# Patient Record
Sex: Female | Born: 1980 | Race: White | Hispanic: No | Marital: Single | Smoking: Never smoker
Health system: Southern US, Community
[De-identification: ages and names within clinical notes are randomized; demographics above are authoritative.]

## PROBLEM LIST (undated history)

## (undated) ENCOUNTER — Inpatient Hospital Stay (HOSPITAL_COMMUNITY): Payer: Self-pay

## (undated) DIAGNOSIS — B379 Candidiasis, unspecified: Secondary | ICD-10-CM

## (undated) DIAGNOSIS — F419 Anxiety disorder, unspecified: Secondary | ICD-10-CM

## (undated) DIAGNOSIS — G43909 Migraine, unspecified, not intractable, without status migrainosus: Secondary | ICD-10-CM

## (undated) DIAGNOSIS — K219 Gastro-esophageal reflux disease without esophagitis: Secondary | ICD-10-CM

## (undated) DIAGNOSIS — B999 Unspecified infectious disease: Secondary | ICD-10-CM

## (undated) DIAGNOSIS — T148XXA Other injury of unspecified body region, initial encounter: Secondary | ICD-10-CM

---

## 2008-04-30 ENCOUNTER — Inpatient Hospital Stay (HOSPITAL_COMMUNITY): Admission: AD | Admit: 2008-04-30 | Discharge: 2008-04-30 | Payer: Self-pay | Admitting: Obstetrics and Gynecology

## 2008-05-05 ENCOUNTER — Inpatient Hospital Stay (HOSPITAL_COMMUNITY): Admission: AD | Admit: 2008-05-05 | Discharge: 2008-05-07 | Payer: Self-pay | Admitting: Obstetrics and Gynecology

## 2009-03-02 ENCOUNTER — Encounter: Payer: Self-pay | Admitting: Obstetrics and Gynecology

## 2009-03-02 ENCOUNTER — Ambulatory Visit: Payer: Self-pay | Admitting: Obstetrics and Gynecology

## 2009-03-02 LAB — CONVERTED CEMR LAB
Antibody Screen: NEGATIVE
Basophils Absolute: 0 10*3/uL (ref 0.0–0.1)
Eosinophils Absolute: 0.1 10*3/uL (ref 0.0–0.7)
Eosinophils Relative: 1 % (ref 0–5)
Lymphocytes Relative: 12 % (ref 12–46)
Lymphs Abs: 1 10*3/uL (ref 0.7–4.0)
Neutrophils Relative %: 78 % — ABNORMAL HIGH (ref 43–77)
Platelets: 248 10*3/uL (ref 150–400)
RDW: 14.3 % (ref 11.5–15.5)
Rubella: >500 intl units/mL — ABNORMAL HIGH
WBC: 8 10*3/uL (ref 4.0–10.5)

## 2009-03-03 ENCOUNTER — Encounter: Payer: Self-pay | Admitting: Obstetrics and Gynecology

## 2009-03-03 LAB — CONVERTED CEMR LAB
Trich, Wet Prep: NONE SEEN
Yeast Wet Prep HPF POC: NONE SEEN

## 2009-03-09 ENCOUNTER — Ambulatory Visit (HOSPITAL_COMMUNITY): Admission: RE | Admit: 2009-03-09 | Discharge: 2009-03-09 | Payer: Self-pay | Admitting: Family Medicine

## 2009-03-23 ENCOUNTER — Ambulatory Visit: Payer: Self-pay | Admitting: Obstetrics and Gynecology

## 2009-03-23 ENCOUNTER — Encounter: Payer: Self-pay | Admitting: Physician Assistant

## 2009-03-30 ENCOUNTER — Encounter: Payer: Self-pay | Admitting: Physician Assistant

## 2009-03-30 ENCOUNTER — Ambulatory Visit: Payer: Self-pay | Admitting: Obstetrics and Gynecology

## 2009-04-06 ENCOUNTER — Ambulatory Visit: Payer: Self-pay | Admitting: Obstetrics and Gynecology

## 2009-04-13 ENCOUNTER — Ambulatory Visit: Payer: Self-pay | Admitting: Obstetrics and Gynecology

## 2009-04-17 ENCOUNTER — Inpatient Hospital Stay (HOSPITAL_COMMUNITY): Admission: AD | Admit: 2009-04-17 | Discharge: 2009-04-20 | Payer: Self-pay | Admitting: Obstetrics and Gynecology

## 2009-04-17 ENCOUNTER — Ambulatory Visit: Payer: Self-pay | Admitting: Obstetrics and Gynecology

## 2010-06-11 LAB — POCT URINALYSIS DIP (DEVICE)
Glucose, UA: NEGATIVE mg/dL
Glucose, UA: NEGATIVE mg/dL
Hgb urine dipstick: NEGATIVE
Ketones, ur: >=160 mg/dL — AB
Ketones, ur: 80 mg/dL — AB
Protein, ur: >=300 mg/dL — AB
Specific Gravity, Urine: 1.02 (ref 1.005–1.030)
Specific Gravity, Urine: >=1.03 (ref 1.005–1.030)
Specific Gravity, Urine: >=1.03 (ref 1.005–1.030)
pH: 5.5 (ref 5.0–8.0)

## 2010-06-11 LAB — CBC
HCT: 26.9 % — ABNORMAL LOW (ref 36.0–46.0)
HCT: 27 % — ABNORMAL LOW (ref 36.0–46.0)
Hemoglobin: 8.6 g/dL — ABNORMAL LOW (ref 12.0–15.0)
MCHC: 30.8 g/dL (ref 30.0–36.0)
MCHC: 31.9 g/dL (ref 30.0–36.0)
MCV: 61.9 fL — ABNORMAL LOW (ref 78.0–100.0)
MCV: 61.9 fL — ABNORMAL LOW (ref 78.0–100.0)
Platelets: 299 10*3/uL (ref 150–400)
RBC: 4.35 MIL/uL (ref 3.87–5.11)
RDW: 15.6 % — ABNORMAL HIGH (ref 11.5–15.5)
WBC: 6.9 10*3/uL (ref 4.0–10.5)

## 2010-06-26 LAB — POCT URINALYSIS DIP (DEVICE)
Nitrite: POSITIVE — AB
Protein, ur: 100 mg/dL — AB
Specific Gravity, Urine: 1.025 (ref 1.005–1.030)
Urobilinogen, UA: 2 mg/dL — ABNORMAL HIGH (ref 0.0–1.0)

## 2010-06-27 LAB — POCT URINALYSIS DIP (DEVICE)
Glucose, UA: 100 mg/dL — AB
Hgb urine dipstick: NEGATIVE
Nitrite: NEGATIVE
Urobilinogen, UA: 1 mg/dL (ref 0.0–1.0)
pH: 6 (ref 5.0–8.0)

## 2010-07-11 LAB — CBC
MCHC: 32.5 g/dL (ref 30.0–36.0)
Platelets: 204 10*3/uL (ref 150–400)
RBC: 4.05 MIL/uL (ref 3.87–5.11)
RBC: 4.81 MIL/uL (ref 3.87–5.11)
RDW: 19.9 % — ABNORMAL HIGH (ref 11.5–15.5)
WBC: 16.7 10*3/uL — ABNORMAL HIGH (ref 4.0–10.5)

## 2010-07-11 LAB — RPR: RPR Ser Ql: NONREACTIVE
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
## 2010-10-01 ENCOUNTER — Emergency Department (HOSPITAL_COMMUNITY): Payer: Medicaid Other

## 2010-10-01 ENCOUNTER — Inpatient Hospital Stay (HOSPITAL_COMMUNITY)
Admission: EM | Admit: 2010-10-01 | Discharge: 2010-10-05 | DRG: 089 | Disposition: A | Discharge status: Home Health | Payer: Medicaid Other | Attending: Surgery | Admitting: Surgery

## 2010-10-01 DIAGNOSIS — R29898 Other symptoms and signs involving the musculoskeletal system: Secondary | ICD-10-CM | POA: Diagnosis present

## 2010-10-01 DIAGNOSIS — S139XXA Sprain of joints and ligaments of unspecified parts of neck, initial encounter: Secondary | ICD-10-CM | POA: Diagnosis present

## 2010-10-01 DIAGNOSIS — W010XXA Fall on same level from slipping, tripping and stumbling without subsequent striking against object, initial encounter: Secondary | ICD-10-CM | POA: Diagnosis present

## 2010-10-01 DIAGNOSIS — N390 Urinary tract infection, site not specified: Secondary | ICD-10-CM | POA: Diagnosis not present

## 2010-10-01 DIAGNOSIS — S060X0A Concussion without loss of consciousness, initial encounter: Principal | ICD-10-CM | POA: Diagnosis present

## 2010-10-01 DIAGNOSIS — R42 Dizziness and giddiness: Secondary | ICD-10-CM | POA: Diagnosis present

## 2010-10-01 LAB — POCT I-STAT, CHEM 8
BUN: 17 mg/dL (ref 6–23)
Chloride: 106 mEq/L (ref 96–112)
Sodium: 139 mEq/L (ref 135–145)

## 2010-10-01 LAB — CBC
HCT: 34.9 % — ABNORMAL LOW (ref 36.0–46.0)
Hemoglobin: 11.8 g/dL — ABNORMAL LOW (ref 12.0–15.0)
MCV: 73.6 fL — ABNORMAL LOW (ref 78.0–100.0)
RBC: 4.74 MIL/uL (ref 3.87–5.11)
WBC: 7.9 10*3/uL (ref 4.0–10.5)

## 2010-10-01 LAB — COMPREHENSIVE METABOLIC PANEL
ALT: 13 U/L (ref 0–35)
AST: 15 U/L (ref 0–37)
CO2: 22 mEq/L (ref 19–32)
Calcium: 9.4 mg/dL (ref 8.4–10.5)
Chloride: 102 mEq/L (ref 96–112)
GFR calc non Af Amer: >60 mL/min (ref >60)
Potassium: 3.9 mEq/L (ref 3.5–5.1)
Sodium: 135 mEq/L (ref 135–145)
Total Bilirubin: 0.5 mg/dL (ref 0.3–1.2)

## 2010-10-01 LAB — TYPE AND SCREEN

## 2010-10-01 LAB — LACTIC ACID, PLASMA: Lactic Acid, Venous: 0.9 mmol/L (ref 0.5–2.2)

## 2010-10-02 ENCOUNTER — Inpatient Hospital Stay (HOSPITAL_COMMUNITY): Payer: Medicaid Other

## 2010-10-02 DIAGNOSIS — IMO0002 Reserved for concepts with insufficient information to code with codable children: Secondary | ICD-10-CM

## 2010-10-03 DIAGNOSIS — IMO0002 Reserved for concepts with insufficient information to code with codable children: Secondary | ICD-10-CM

## 2010-10-03 DIAGNOSIS — S069X9A Unspecified intracranial injury with loss of consciousness of unspecified duration, initial encounter: Secondary | ICD-10-CM

## 2010-10-05 LAB — URINALYSIS, ROUTINE W REFLEX MICROSCOPIC
Ketones, ur: NEGATIVE mg/dL
Nitrite: NEGATIVE
Protein, ur: NEGATIVE mg/dL
pH: 6.5 (ref 5.0–8.0)

## 2010-10-05 LAB — URINE MICROSCOPIC-ADD ON

## 2010-10-06 LAB — URINE CULTURE
Colony Count: 15000
Culture  Setup Time: 201207121157

## 2010-10-19 ENCOUNTER — Encounter (INDEPENDENT_AMBULATORY_CARE_PROVIDER_SITE_OTHER): Payer: Medicaid Other
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
## 2012-03-26 NOTE — L&D Delivery Note (Signed)
Delivery Note At 11:18 PM a viable and healthy female was delivered via Vaginal, Spontaneous Delivery (Presentation:OA ;  ). Loose nuchal cord, delivered through  APGAR:8 ,9 ; weight pending .   Placenta status: intact with a 3 vessel Cord:  with the following complications:none.  Pt had persistent, slow trickle from the uterus, so cytotec given PR.    Anesthesia: Epidural  Episiotomy: None Lacerations: 1st degree tear of posterior vaginal mucosa not requiring sutures Suture Repair: None Est. Blood Loss (200cc):   Mom to postpartum.  Baby to Couplet care / Skin to Skin. Delivery supervised by Cathie Beams, CNM.     [name] 02/26/2013, 11:44 PM    I have seen and examined this patient and agree the above assessment. CRESENZO-DISHMAN,[name] 02/27/2013 3:51 AM

## 2012-03-26 NOTE — L&D Delivery Note (Signed)
Attestation of Attending Supervision of Advanced Practitioner: Evaluation and management procedures were performed by the PA/NP/CNM/OB Fellow under my supervision/collaboration. Chart reviewed and agree with management and plan.  Evander Macaraeg V 03/04/2013 1:31 PM

## 2012-09-12 ENCOUNTER — Inpatient Hospital Stay (HOSPITAL_COMMUNITY)
Admission: AD | Admit: 2012-09-12 | Discharge: 2012-09-12 | Disposition: A | Discharge status: Home / Self Care | Payer: Medicaid Other | Source: Ambulatory Visit | Attending: Obstetrics & Gynecology | Admitting: Obstetrics & Gynecology

## 2012-09-12 ENCOUNTER — Encounter (HOSPITAL_COMMUNITY): Payer: Self-pay | Admitting: *Deleted

## 2012-09-12 DIAGNOSIS — R109 Unspecified abdominal pain: Secondary | ICD-10-CM | POA: Insufficient documentation

## 2012-09-12 DIAGNOSIS — O99891 Other specified diseases and conditions complicating pregnancy: Secondary | ICD-10-CM | POA: Insufficient documentation

## 2012-09-12 DIAGNOSIS — N898 Other specified noninflammatory disorders of vagina: Secondary | ICD-10-CM

## 2012-09-12 DIAGNOSIS — L293 Anogenital pruritus, unspecified: Secondary | ICD-10-CM | POA: Insufficient documentation

## 2012-09-12 DIAGNOSIS — N949 Unspecified condition associated with female genital organs and menstrual cycle: Secondary | ICD-10-CM

## 2012-09-12 DIAGNOSIS — M545 Low back pain, unspecified: Secondary | ICD-10-CM | POA: Insufficient documentation

## 2012-09-12 DIAGNOSIS — Z3490 Encounter for supervision of normal pregnancy, unspecified, unspecified trimester: Secondary | ICD-10-CM

## 2012-09-12 HISTORY — DX: Unspecified infectious disease: B99.9

## 2012-09-12 HISTORY — DX: Candidiasis, unspecified: B37.9

## 2012-09-12 LAB — URINALYSIS, ROUTINE W REFLEX MICROSCOPIC
Glucose, UA: NEGATIVE mg/dL
Hgb urine dipstick: NEGATIVE
Specific Gravity, Urine: 1.02 (ref 1.005–1.030)
pH: 7.5 (ref 5.0–8.0)

## 2012-09-12 LAB — WET PREP, GENITAL: Yeast Wet Prep HPF POC: NONE SEEN

## 2012-09-12 MED ORDER — TERCONAZOLE 0.8 % VA CREA: 1.0000 | TOPICAL_CREAM | Freq: Every day | VAGINAL | Status: DC

## 2012-09-12 NOTE — MAU Note (Signed)
Pain in sides around to back, came on really stronger yesterday.  Having cramping in lower abd. Still not feeling any movement.  Has not seen a dr yet.

## 2012-09-12 NOTE — MAU Provider Note (Signed)
History     CSN: [id number]  Arrival date and time: 09/12/12 6213   First Provider Initiated Contact with Patient 09/12/12 1102      Chief Complaint  Patient presents with  . Abdominal Pain  . Back Pain   HPI [name] is 32 y.o. G3P2 [redacted]w[redacted]d weeks presenting with lower abdominal pain that wraps to lower back.  Also has vaginal discharge with itching and burning.   Denies vaginal bleeding.  Has not yet felt fetal movement.  Hx of yeast and UTIs.  Has not begun prenatal care , need verification letter.  Plans care in the [facility].     Past Medical History  Diagnosis Date  . Infection   . UTI (lower urinary tract infection)   . Yeast infection     Past Surgical History  Procedure Laterality Date  . No past surgeries      History reviewed. No pertinent family history.  History  Substance Use Topics  . Smoking status: Not on file  . Smokeless tobacco: Not on file  . Alcohol Use: Not on file    Allergies: No Known Allergies  Prescriptions prior to admission  Medication Sig Dispense Refill  . acetaminophen (TYLENOL) 500 MG tablet Take 1,500 mg by mouth daily as needed (headaches).      . ranitidine (ZANTAC) 75 MG tablet Take 75 mg by mouth daily.        Review of Systems  Constitutional: Negative for fever and chills.  Gastrointestinal: Positive for abdominal pain (lower abdominal pain). Negative for nausea and vomiting.  Musculoskeletal: Positive for back pain.  Neurological: Negative for headaches.   Physical Exam   Blood pressure 113/68, pulse 94, temperature 98.1 F (36.7 C), temperature source Oral, resp. rate 16, last menstrual period 05/12/2012.  Physical Exam  Constitutional: She is oriented to person, place, and time. She appears well-developed and well-nourished. No distress.  HENT:  Head: Normocephalic.  Neck: Normal range of motion.  Cardiovascular: Normal rate.   Respiratory: Effort normal.  GI: Soft. She exhibits no distension  and no mass. There is no tenderness. There is no rebound and no guarding.  Genitourinary: There is no rash, tenderness or lesion on the right labia. There is no rash, tenderness or lesion on the left labia. Uterus is enlarged (measures 17-18 week size). Uterus is not tender. Cervix exhibits no motion tenderness, no discharge and no friability. No bleeding around the vagina. Vaginal discharge (small amount of white discharge without odor) found.  Neurological: She is alert and oriented to person, place, and time.  Skin: Skin is warm and dry.  Psychiatric: She has a normal mood and affect. Her behavior is normal.   + FHT by doppler  Results for orders placed during the hospital encounter of 09/12/12 (from the past 24 hour(s))  URINALYSIS, ROUTINE W REFLEX MICROSCOPIC     Status: Abnormal   Collection Time    09/12/12 10:05 AM      Result Value Range   Color, Urine YELLOW  YELLOW   APPearance CLOUDY (*) CLEAR   Specific Gravity, Urine 1.020  1.005 - 1.030   pH 7.5  5.0 - 8.0   Glucose, UA NEGATIVE  NEGATIVE mg/dL   Hgb urine dipstick NEGATIVE  NEGATIVE   Bilirubin Urine NEGATIVE  NEGATIVE   Ketones, ur NEGATIVE  NEGATIVE mg/dL   Protein, ur NEGATIVE  NEGATIVE mg/dL   Urobilinogen, UA 0.2  0.0 - 1.0 mg/dL   Nitrite NEGATIVE  NEGATIVE  Leukocytes, UA NEGATIVE  NEGATIVE  WET PREP, GENITAL     Status: Abnormal   Collection Time    09/12/12 10:54 AM      Result Value Range   Yeast Wet Prep HPF POC NONE SEEN  NONE SEEN   Trich, Wet Prep NONE SEEN  NONE SEEN   Clue Cells Wet Prep HPF POC NONE SEEN  NONE SEEN   WBC, Wet Prep HPF POC MANY (*) NONE SEEN   MAU Course  Procedures  MDM   Assessment and Plan  A:  Viable IUP with + FHT by doppler at [redacted]w[redacted]d     Vaginal itching    Round ligament pain P:  Letter of verification given to patient     Rx for Terazol 3 vaginal creme     Begin prenatal care with clinic-call for appointment [name],EVE M 09/12/2012, 12:24 PM

## 2012-09-12 NOTE — MAU Note (Addendum)
Patient presents to MAU with c/o lower abdominal cramping radiating from back x 2 days; vaginal itching and burning with white discharge. Reports hx of yeast infections and UTIs.  Patient has no PNC. Needs pg verification for Medicaid. Patient reports daily headache, including today.

## 2012-09-14 NOTE — MAU Provider Note (Signed)
Attestation of Attending Supervision of Advanced Practitioner (CNM/NP): Evaluation and management procedures were performed by the Advanced Practitioner under my supervision and collaboration. I have reviewed the Advanced Practitioner's note and chart, and I agree with the management and plan.  LEGGETT,KELLY H. 11:42 AM   

## 2012-10-08 ENCOUNTER — Ambulatory Visit (INDEPENDENT_AMBULATORY_CARE_PROVIDER_SITE_OTHER): Payer: Medicaid Other | Admitting: Advanced Practice Midwife

## 2012-10-08 ENCOUNTER — Encounter: Payer: Self-pay | Admitting: Advanced Practice Midwife

## 2012-10-08 VITALS — BP 104/71 | Temp 97.1°F | Ht 61.0 in | Wt 136.0 lb

## 2012-10-08 DIAGNOSIS — Z3201 Encounter for pregnancy test, result positive: Secondary | ICD-10-CM

## 2012-10-08 DIAGNOSIS — G43909 Migraine, unspecified, not intractable, without status migrainosus: Secondary | ICD-10-CM

## 2012-10-08 DIAGNOSIS — O0930 Supervision of pregnancy with insufficient antenatal care, unspecified trimester: Secondary | ICD-10-CM

## 2012-10-08 DIAGNOSIS — O0932 Supervision of pregnancy with insufficient antenatal care, second trimester: Secondary | ICD-10-CM

## 2012-10-08 LAB — POCT URINALYSIS DIP (DEVICE)
Glucose, UA: NEGATIVE mg/dL
Hgb urine dipstick: NEGATIVE
Ketones, ur: NEGATIVE mg/dL
Nitrite: NEGATIVE
Specific Gravity, Urine: 1.025 (ref 1.005–1.030)

## 2012-10-08 MED ORDER — PRENATAL VITAMINS PLUS 27-1 MG PO TABS: 1.0000 | ORAL_TABLET | Freq: Every day | ORAL | Status: DC

## 2012-10-08 MED ORDER — BUTALBITAL-APAP-CAFFEINE 50-325-40 MG PO TABS: 1.0000 | ORAL_TABLET | Freq: Four times a day (QID) | ORAL | Status: DC | PRN

## 2012-10-08 NOTE — Progress Notes (Signed)
Pulse 95 Edema trace in feet. C/o pain and pressure in groin area. Vaginal d/c stated as white with itch.

## 2012-10-08 NOTE — Progress Notes (Signed)
New OB. Seen other note C/O round ligament pain and some uterine cramping. Pap today.  Glucola today  Also had migraines  Subjective:    [name] is a [id number] [redacted]w[redacted]d being seen today for her first obstetrical visit.  Her obstetrical history is significant for Late prenatal care. Patient does intend to breast feed. Pregnancy history fully reviewed.  Patient reports cramping, round ligament pain, and migraines.  Filed Vitals:   10/08/12 0831 10/08/12 0834  BP: 104/71   Temp: 97.1 F (36.2 C)   Height:  5\' 1"  (1.549 m)  Weight: 61.689 kg (136 lb)     HISTORY: OB History   Grav Para Term Preterm Abortions TAB SAB Ect Mult Living   3 2 2       2      # Outc Date GA Lbr Len/2nd Wgt Sex Del Anes PTL Lv   1 TRM  [redacted]w[redacted]d  4.540JW(1XB1YN)  SVD   Yes   2 TRM  [redacted]w[redacted]d  8.295AO(1HY8MV)  SVD   Yes   3 CUR              Past Medical History  Diagnosis Date  . Infection   . UTI (lower urinary tract infection)   . Yeast infection    Past Surgical History  Procedure Laterality Date  . No past surgeries     Family History  Problem Relation Age of Onset  . Diabetes Mother      Exam    Uterus:     Pelvic Exam:    Perineum: No Hemorrhoids, Normal Perineum   Vulva: Bartholin's, Urethra, Skene's normal   Vagina:  normal discharge, wet prep done   pH:    Cervix: multiparous appearance and no lesions   Adnexa: normal adnexa and no mass, fullness, tenderness   Bony Pelvis: gynecoid  System: Breast:  normal appearance, no masses or tenderness   Skin: normal coloration and turgor, no rashes    Neurologic: oriented, grossly non-focal   Extremities: normal strength, tone, and muscle mass   HEENT neck supple with midline trachea   Mouth/Teeth mucous membranes moist, pharynx normal without lesions   Neck supple and no masses   Cardiovascular: regular rate and rhythm, no murmurs or gallops   Respiratory:  appears well, vitals normal, no respiratory distress, acyanotic, normal RR, ear  and throat exam is normal, neck free of mass or lymphadenopathy, chest clear, no wheezing, crepitations, rhonchi, normal symmetric air entry   Abdomen: soft, non-tender; bowel sounds normal; no masses,  no organomegaly   Urinary: urethral meatus normal      Assessment:    Pregnancy: [id number] Patient Active Problem List   Diagnosis Date Noted  . Late prenatal care complicating pregnancy in second trimester 10/09/2012        Plan:     Initial labs drawn. Prenatal vitamins. Problem list reviewed and updated. Genetic Screening discussed Integrated Screen: too late.  Ultrasound discussed; fetal survey: ordered.  Follow up in 4 weeks. 50% of 30 min visit spent on counseling and coordination of care.  Pap done   [facility] 10/09/2012

## 2012-10-09 DIAGNOSIS — O0932 Supervision of pregnancy with insufficient antenatal care, second trimester: Secondary | ICD-10-CM | POA: Insufficient documentation

## 2012-10-09 LAB — OBSTETRIC PANEL
Eosinophils Absolute: 0.1 10*3/uL (ref 0.0–0.7)
Hemoglobin: 10.4 g/dL — ABNORMAL LOW (ref 12.0–15.0)
Hepatitis B Surface Ag: NEGATIVE
Lymphs Abs: 0.8 10*3/uL (ref 0.7–4.0)
MCH: 24.6 pg — ABNORMAL LOW (ref 26.0–34.0)
MCV: 74.7 fL — ABNORMAL LOW (ref 78.0–100.0)
Monocytes Relative: 7 % (ref 3–12)
Neutrophils Relative %: 79 % — ABNORMAL HIGH (ref 43–77)
RBC: 4.23 MIL/uL (ref 3.87–5.11)
Rh Type: POSITIVE

## 2012-10-09 LAB — GLUCOSE TOLERANCE, 1 HOUR (50G) W/O FASTING: Glucose, 1 Hour GTT: 99 mg/dL (ref 70–140)

## 2012-10-09 NOTE — Patient Instructions (Signed)
Pregnancy - Second Trimester The second trimester is the period between 13 to 27 weeks of your pregnancy. It is important to follow your doctor's instructions. HOME CARE   Do not smoke.  Do not drink alcohol or use drugs.  Only take medicine as told by your doctor.  Take prenatal vitamins as told. The vitamin should contain 1 milligram of folic acid.  Exercise.  Eat healthy foods. Eat regular, well-balanced meals.  You can have sex (intercourse) if there are no other problems with the pregnancy.  Do not use hot tubs, steam rooms, or saunas.  Wear a seat belt while driving.  Avoid raw meat, uncooked cheese, and litter boxes and soil used by cats.  Visit your dentist. Cleanings are okay. GET HELP RIGHT AWAY IF:   You have a temperature by mouth above 102 F (38.9 C), not controlled by medicine.  Fluid is coming from your vagina.  Blood is coming from your vagina. Light spotting is common, especially after sex (intercourse).  You have a bad smelling fluid (discharge) coming from the vagina. The fluid changes from clear to white.  You still feel sick to your stomach (nauseous).  You throw up (vomit) blood.  You lose or gain more than 2 pounds (0.9 kilograms) of weight in a week, or as suggested by your doctor.  Your face, hands, feet, or legs get puffy (swell).  You get exposed to German measles and have never had them.  You get exposed to fifth disease or chickenpox.  You have belly (abdominal) pain.  You have a bad headache that will not go away.  You have watery poop (diarrhea), pain when you pee (urinate), or have shortness of breath.  You start to have problems seeing (blurry or double vision).  You fall, are in a car accident, or have any kind of trauma.  There is mental or physical violence at home.  You have any concerns or worries during your pregnancy. MAKE SURE YOU:   Understand these instructions.  Will watch your condition.  Will get help  right away if you are not doing well or get worse. Document Released: 06/06/2009 Document Revised: 06/04/2011 Document Reviewed: 06/06/2009 ExitCare Patient Information 2014 [employer].  

## 2012-10-13 ENCOUNTER — Ambulatory Visit (HOSPITAL_COMMUNITY): Payer: Self-pay

## 2012-10-14 ENCOUNTER — Ambulatory Visit (HOSPITAL_COMMUNITY): Payer: Self-pay | Attending: Advanced Practice Midwife

## 2012-10-22 ENCOUNTER — Ambulatory Visit (HOSPITAL_COMMUNITY)
Admission: RE | Admit: 2012-10-22 | Discharge: 2012-10-22 | Disposition: A | Discharge status: Home / Self Care | Payer: Medicaid Other | Source: Ambulatory Visit | Attending: Advanced Practice Midwife | Admitting: Advanced Practice Midwife

## 2012-10-22 ENCOUNTER — Encounter: Payer: Self-pay | Admitting: Advanced Practice Midwife

## 2012-10-22 DIAGNOSIS — O358XX0 Maternal care for other (suspected) fetal abnormality and damage, not applicable or unspecified: Secondary | ICD-10-CM | POA: Insufficient documentation

## 2012-10-22 DIAGNOSIS — Z363 Encounter for antenatal screening for malformations: Secondary | ICD-10-CM | POA: Insufficient documentation

## 2012-10-22 DIAGNOSIS — Z1389 Encounter for screening for other disorder: Secondary | ICD-10-CM | POA: Insufficient documentation

## 2012-10-22 DIAGNOSIS — Z3201 Encounter for pregnancy test, result positive: Secondary | ICD-10-CM

## 2012-10-30 ENCOUNTER — Telehealth: Payer: Self-pay | Admitting: *Deleted

## 2012-10-30 DIAGNOSIS — G43909 Migraine, unspecified, not intractable, without status migrainosus: Secondary | ICD-10-CM

## 2012-10-30 MED ORDER — BUTALBITAL-APAP-CAFFEINE 50-325-40 MG PO TABS: 1.0000 | ORAL_TABLET | Freq: Four times a day (QID) | ORAL | Status: DC | PRN

## 2012-10-30 NOTE — Telephone Encounter (Signed)
[name] called and left a message stating she was in the office  A few weeks ago and that she must have left her prescription in the office-because she can't find them and was waiting to get her mediciad toget them filled.  Asks for a call back.  Per chart review 10/09/12 had PNV sent to [facility] and fioricet printed.

## 2012-10-30 NOTE — Telephone Encounter (Signed)
Discussed with Dr,. Harraway-Smith may send in refill fioricet to her pharmacy.  E- prescibed, but did not go thru- called in to pharmacy.  Called [place] and notified her we are sending it in to her  pharmacy

## 2012-11-05 ENCOUNTER — Ambulatory Visit (INDEPENDENT_AMBULATORY_CARE_PROVIDER_SITE_OTHER): Payer: Medicaid Other | Admitting: Advanced Practice Midwife

## 2012-11-05 DIAGNOSIS — O0930 Supervision of pregnancy with insufficient antenatal care, unspecified trimester: Secondary | ICD-10-CM

## 2012-11-05 MED ORDER — LEVOTHYROXINE SODIUM 100 MCG PO TABS: 100.0000 ug | ORAL_TABLET | Freq: Every day | ORAL | Status: DC

## 2012-11-05 MED ORDER — CABERGOLINE 0.5 MG PO TABS: 0.2500 mg | ORAL_TABLET | ORAL | Status: DC

## 2012-11-05 NOTE — Progress Notes (Signed)
Chart opened, but patient was a no show for appointment

## 2012-11-25 ENCOUNTER — Encounter: Payer: Medicaid Other | Admitting: Family Medicine

## 2012-12-18 ENCOUNTER — Encounter: Payer: Self-pay | Admitting: *Deleted

## 2012-12-23 ENCOUNTER — Ambulatory Visit (INDEPENDENT_AMBULATORY_CARE_PROVIDER_SITE_OTHER): Payer: Medicaid Other | Admitting: Obstetrics & Gynecology

## 2012-12-23 VITALS — BP 117/74

## 2012-12-23 DIAGNOSIS — A499 Bacterial infection, unspecified: Secondary | ICD-10-CM

## 2012-12-23 DIAGNOSIS — N760 Acute vaginitis: Secondary | ICD-10-CM

## 2012-12-23 DIAGNOSIS — O0930 Supervision of pregnancy with insufficient antenatal care, unspecified trimester: Secondary | ICD-10-CM

## 2012-12-23 DIAGNOSIS — N898 Other specified noninflammatory disorders of vagina: Secondary | ICD-10-CM

## 2012-12-23 DIAGNOSIS — Z23 Encounter for immunization: Secondary | ICD-10-CM

## 2012-12-23 DIAGNOSIS — O9989 Other specified diseases and conditions complicating pregnancy, childbirth and the puerperium: Secondary | ICD-10-CM

## 2012-12-23 DIAGNOSIS — O0932 Supervision of pregnancy with insufficient antenatal care, second trimester: Secondary | ICD-10-CM

## 2012-12-23 DIAGNOSIS — B9689 Other specified bacterial agents as the cause of diseases classified elsewhere: Secondary | ICD-10-CM

## 2012-12-23 LAB — POCT URINALYSIS DIP (DEVICE)
Glucose, UA: 100 mg/dL — AB
Hgb urine dipstick: NEGATIVE
Nitrite: NEGATIVE

## 2012-12-23 MED ORDER — TETANUS-DIPHTH-ACELL PERTUSSIS 5-2.5-18.5 LF-MCG/0.5 IM SUSP
0.5000 mL | Freq: Once | INTRAMUSCULAR | Status: AC
  Administered 2012-12-23: 0.5 mL via INTRAMUSCULAR

## 2012-12-23 NOTE — Progress Notes (Signed)
P = 103    Pt continues to have vaginal itching and d/c w/odor.

## 2012-12-23 NOTE — Progress Notes (Signed)
Pelvic exam showed scant yellow-white discharge, wet prep done.  Will follow up results and manage accordingly.  Counseled about Tdap, she will receive this today.  No other complaints or concerns.  Fetal movement and labor precautions reviewed.

## 2012-12-23 NOTE — Patient Instructions (Signed)
Return to clinic for any obstetric concerns or go to MAU for evaluation  

## 2012-12-24 ENCOUNTER — Encounter: Payer: Self-pay | Admitting: *Deleted

## 2012-12-24 LAB — WET PREP, GENITAL: Trich, Wet Prep: NONE SEEN

## 2012-12-25 MED ORDER — METRONIDAZOLE 500 MG PO TABS
500.0000 mg | ORAL_TABLET | Freq: Two times a day (BID) | ORAL | Status: AC
Start: 2012-12-25 — End: 2013-01-01

## 2012-12-25 NOTE — Addendum Note (Signed)
Addended by: Jaynie Collins A on: 12/25/2012 04:34 PM   Modules accepted: Orders

## 2012-12-30 ENCOUNTER — Telehealth: Payer: Self-pay | Admitting: *Deleted

## 2012-12-30 NOTE — Telephone Encounter (Signed)
Message copied by Mannie Stabile on Tue Dec 30, 2012 10:13 AM ------      Message from: Odelia Gage A      Created: Fri Dec 26, 2012  7:43 AM                   ----- Message -----         From: Tereso Newcomer, MD         Sent: 12/25/2012   4:34 PM           To: [facility] [employer] prep showed clue cells, Metronidazole e-prescribed. Please call  to inform her of diagnosis and treatment, and to go pick up the prescription. ------

## 2012-12-30 NOTE — Telephone Encounter (Signed)
Left message for patient to call back for results.  

## 2013-01-04 ENCOUNTER — Encounter (HOSPITAL_COMMUNITY): Payer: Self-pay | Admitting: *Deleted

## 2013-01-04 ENCOUNTER — Inpatient Hospital Stay (HOSPITAL_COMMUNITY)
Admission: AD | Admit: 2013-01-04 | Discharge: 2013-01-04 | Disposition: A | Discharge status: Home / Self Care | Payer: Medicaid Other | Source: Ambulatory Visit | Attending: Obstetrics & Gynecology | Admitting: Obstetrics & Gynecology

## 2013-01-04 DIAGNOSIS — R109 Unspecified abdominal pain: Secondary | ICD-10-CM | POA: Insufficient documentation

## 2013-01-04 DIAGNOSIS — O2390 Unspecified genitourinary tract infection in pregnancy, unspecified trimester: Secondary | ICD-10-CM | POA: Insufficient documentation

## 2013-01-04 DIAGNOSIS — N760 Acute vaginitis: Secondary | ICD-10-CM | POA: Insufficient documentation

## 2013-01-04 DIAGNOSIS — B9689 Other specified bacterial agents as the cause of diseases classified elsewhere: Secondary | ICD-10-CM | POA: Insufficient documentation

## 2013-01-04 DIAGNOSIS — A499 Bacterial infection, unspecified: Secondary | ICD-10-CM | POA: Insufficient documentation

## 2013-01-04 DIAGNOSIS — O4700 False labor before 37 completed weeks of gestation, unspecified trimester: Secondary | ICD-10-CM | POA: Insufficient documentation

## 2013-01-04 DIAGNOSIS — E860 Dehydration: Secondary | ICD-10-CM

## 2013-01-04 LAB — URINALYSIS, ROUTINE W REFLEX MICROSCOPIC
Leukocytes, UA: NEGATIVE
Nitrite: NEGATIVE
Specific Gravity, Urine: 1.025 (ref 1.005–1.030)
Urobilinogen, UA: 2 mg/dL — ABNORMAL HIGH (ref 0.0–1.0)

## 2013-01-04 NOTE — MAU Note (Signed)
Contractions 5 mins apart for the last 5 hours. Good fetal movement.  Denies leaking of fluid and vaginal bleeding.

## 2013-01-04 NOTE — MAU Provider Note (Signed)
Chief Complaint:  Contractions   [name] is a 32 y.o.  [id number] with IUP at [redacted]w[redacted]d presenting for Contractions  States has been having pains since 3:30. Having constant lower abdominal pain and then about every 5 minutes gets a sharp vaginal pain. +FM. No vb, LOF.  No recent intercourse. Lots of increased stress. Less fluid intake. Has a hx of migraines and drinks at least 2 sodas a day for extra caffeine. Doesn't drink water. Occasionally juice.  Most days she drinks only around 24 oz of liquid.   Pt is seen in [facility] and at last visit was diagnosed with BV but they were unable to reach her with RX. Still having vaginal dsicharge and never picked up the medicine.    Menstrual History: OB History   Grav Para Term Preterm Abortions TAB SAB Ect Mult Living   3 2 2       2        Patient's last menstrual period was 05/12/2012.      Past Medical History  Diagnosis Date  . Infection   . UTI (lower urinary tract infection)   . Yeast infection     Past Surgical History  Procedure Laterality Date  . No past surgeries      Family History  Problem Relation Age of Onset  . Diabetes Mother     History  Substance Use Topics  . Smoking status: Never Smoker   . Smokeless tobacco: Never Used  . Alcohol Use: No     No Known Allergies  Prescriptions prior to admission  Medication Sig Dispense Refill  . butalbital-acetaminophen-caffeine (FIORICET) 50-325-40 MG per tablet Take 1-2 tablets by mouth every 6 (six) hours as needed for headache.  30 tablet  0  . Prenatal Vit-Fe Fumarate-FA (PRENATAL MULTIVITAMIN) TABS tablet Take 1 tablet by mouth daily at 12 noon.      . ranitidine (ZANTAC) 75 MG tablet Take 75 mg by mouth daily.        Review of Systems - Negative except for what is mentioned in HPI.  Physical Exam  Blood pressure 118/74, pulse 101, temperature 97.1 F (36.2 C), temperature source Oral, resp. rate 18, height 5\' 1"  (1.549 m), weight 63.957 kg (141 lb), last  menstrual period 05/12/2012, SpO2 99.00%. GENERAL: Well-developed, well-nourished female in no acute distress.  LUNGS: Clear to auscultation bilaterally.  HEART: Regular rate and rhythm. ABDOMEN: Soft, nontender, nondistended, gravid.  EXTREMITIES: Nontender, no edema, 2+ distal pulses. Cervical Exam: Dilatation 0.5cm   Effacement 0%   Station ballotable   Presentation: cephalic FHT:  Baseline rate 125 bpm   Variability moderate  Accelerations present   Decelerations none Contractions: no regular contractions, irritable  Labs: Results for orders placed during the hospital encounter of 01/04/13 (from the past 24 hour(s))  URINALYSIS, ROUTINE W REFLEX MICROSCOPIC   Collection Time    01/04/13  8:32 PM      Result Value Range   Color, Urine YELLOW  YELLOW   APPearance CLEAR  CLEAR   Specific Gravity, Urine 1.025  1.005 - 1.030   pH 7.0  5.0 - 8.0   Glucose, UA 100 (*) NEGATIVE mg/dL   Hgb urine dipstick NEGATIVE  NEGATIVE   Bilirubin Urine NEGATIVE  NEGATIVE   Ketones, ur >80 (*) NEGATIVE mg/dL   Protein, ur NEGATIVE  NEGATIVE mg/dL   Urobilinogen, UA 2.0 (*) 0.0 - 1.0 mg/dL   Nitrite NEGATIVE  NEGATIVE   Leukocytes, UA NEGATIVE  NEGATIVE    Imaging  Studies:  No results found.  Assessment: [name] is  32 y.o. G3P2002 at [redacted]w[redacted]d presents with Contractions .  Plan: 1) abdominal pain/uterine irritability - likely due to dehydration. Pt with story of very poor po intake and UA with large ketones and high specific gravity.  - discussed normal fluid intake in pregnancy - if needs  The two sodas in the day for her headache, then needs to drink double that in water to cancel it out and then more as well - PTL precautions also discussed - cervix not a laboring cervix  2) bacterial vaginosis - likely the source of the sharp pains also - rx of flagyl already at pharmacy - pt states she doesn't have the money today but will get it tomorrow for the RX - has appt on Tuesday in  clinic and advised to let us know if she was able to get it.   F/u as scheduled in clinic.   Jiovanni Heeter L 10/12/20149:37 PM

## 2013-01-06 ENCOUNTER — Encounter: Payer: Self-pay | Admitting: Obstetrics & Gynecology

## 2013-01-06 ENCOUNTER — Ambulatory Visit (INDEPENDENT_AMBULATORY_CARE_PROVIDER_SITE_OTHER): Payer: Medicaid Other | Admitting: Obstetrics & Gynecology

## 2013-01-06 VITALS — BP 112/73 | Temp 97.7°F | Wt 141.8 lb

## 2013-01-06 DIAGNOSIS — Z23 Encounter for immunization: Secondary | ICD-10-CM

## 2013-01-06 DIAGNOSIS — O0930 Supervision of pregnancy with insufficient antenatal care, unspecified trimester: Secondary | ICD-10-CM

## 2013-01-06 DIAGNOSIS — O0932 Supervision of pregnancy with insufficient antenatal care, second trimester: Secondary | ICD-10-CM

## 2013-01-06 MED ORDER — SUMATRIPTAN SUCCINATE 100 MG PO TABS: 100.0000 mg | ORAL_TABLET | Freq: Once | ORAL | Status: DC | PRN

## 2013-01-06 NOTE — Progress Notes (Signed)
P= 104 Pt. States she was seen in the ED two days ago for which they were supposed to have prescribed her something for BV (c/o of yellow itchy vaginal discharge) but when she went to pick up the precription it wasn't there; according to notes she was prescribed flagyl and it was sent to her pharmacy.  Pt. Needs refill of prenatals.

## 2013-01-06 NOTE — Progress Notes (Signed)
Routine visit. Good FM. Labor precautions given. Cultures at next visit. Flu vaccine today. She says that the fiorocet isn't working for her migraines. She is willing to try imitrex.

## 2013-01-07 NOTE — Telephone Encounter (Signed)
This was discussed at the visit on 01/06/13

## 2013-01-20 ENCOUNTER — Encounter: Payer: Medicaid Other | Admitting: Obstetrics and Gynecology

## 2013-02-16 ENCOUNTER — Encounter (HOSPITAL_COMMUNITY): Payer: Self-pay

## 2013-02-16 ENCOUNTER — Inpatient Hospital Stay (HOSPITAL_COMMUNITY): Payer: Medicaid Other

## 2013-02-16 ENCOUNTER — Inpatient Hospital Stay (HOSPITAL_COMMUNITY)
Admission: AD | Admit: 2013-02-16 | Discharge: 2013-02-16 | Disposition: A | Discharge status: Home / Self Care | Payer: Medicaid Other | Source: Ambulatory Visit | Attending: Obstetrics and Gynecology | Admitting: Obstetrics and Gynecology

## 2013-02-16 DIAGNOSIS — R0602 Shortness of breath: Secondary | ICD-10-CM | POA: Insufficient documentation

## 2013-02-16 DIAGNOSIS — O368190 Decreased fetal movements, unspecified trimester, not applicable or unspecified: Secondary | ICD-10-CM | POA: Insufficient documentation

## 2013-02-16 DIAGNOSIS — O368131 Decreased fetal movements, third trimester, fetus 1: Secondary | ICD-10-CM

## 2013-02-16 DIAGNOSIS — I951 Orthostatic hypotension: Secondary | ICD-10-CM

## 2013-02-16 DIAGNOSIS — O99891 Other specified diseases and conditions complicating pregnancy: Secondary | ICD-10-CM | POA: Insufficient documentation

## 2013-02-16 DIAGNOSIS — R42 Dizziness and giddiness: Secondary | ICD-10-CM

## 2013-02-16 DIAGNOSIS — R000 Tachycardia, unspecified: Secondary | ICD-10-CM | POA: Insufficient documentation

## 2013-02-16 LAB — CBC
HCT: 26.5 % — ABNORMAL LOW (ref 36.0–46.0)
MCHC: 30.9 g/dL (ref 30.0–36.0)
MCV: 64.6 fL — ABNORMAL LOW (ref 78.0–100.0)
Platelets: 145 10*3/uL — ABNORMAL LOW (ref 150–400)
RBC: 4.1 MIL/uL (ref 3.87–5.11)
RDW: 16.6 % — ABNORMAL HIGH (ref 11.5–15.5)
WBC: 7.2 10*3/uL (ref 4.0–10.5)

## 2013-02-16 LAB — RAPID URINE DRUG SCREEN, HOSP PERFORMED
Barbiturates: NOT DETECTED
Benzodiazepines: NOT DETECTED
Cocaine: NOT DETECTED
Opiates: NOT DETECTED
Tetrahydrocannabinol: NOT DETECTED

## 2013-02-16 LAB — URINALYSIS, ROUTINE W REFLEX MICROSCOPIC
Glucose, UA: NEGATIVE mg/dL
Hgb urine dipstick: NEGATIVE
Protein, ur: NEGATIVE mg/dL
Specific Gravity, Urine: >1.03 — ABNORMAL HIGH (ref 1.005–1.030)
pH: 6 (ref 5.0–8.0)

## 2013-02-16 MED ORDER — LACTATED RINGERS IV BOLUS (SEPSIS)
1000.0000 mL | Freq: Once | INTRAVENOUS | Status: AC
  Administered 2013-02-16: 1000 mL via INTRAVENOUS

## 2013-02-16 NOTE — MAU Note (Signed)
Patient states she has felt less than usual fetal movement since yesterday. States she has had trouble with feeling like she is not getting enough air when she takes a breath. Denies bleeding or leaking.

## 2013-02-16 NOTE — Progress Notes (Signed)
Dr Reola Calkins notified

## 2013-02-16 NOTE — MAU Provider Note (Signed)
Chief Complaint:  Decreased Fetal Movement and Shortness of Breath   [name] is a 32 y.o.  [id number] with IUP at [redacted]w[redacted]d presenting for Decreased Fetal Movement and Shortness of Breath  1) decreased FM - has been feeling her baby move but not as actively as before - feeling little movements still but no big "flips" since 2 days ago - even smaller movements are less close together than before  - no ctx, LOF, VB  2) Shortness of breath - has been going on for the last month  - whether shallow breathing or normal breathings, feels like she cannot get enough air - bothers her constantly through the day and at night - slightly worse with moving around - walks 2-3 steps and then feels more winded.  - has no energy at baseline  - has not had to increase the number of pillows she uses at night, no worsening swelling.   - no fevers, chills, wheezing, cough.   - kids currently sick with a cold.      Menstrual History: OB History   Grav Para Term Preterm Abortions TAB SAB Ect Mult Living   3 2 2       2      G1- term, NSVD G2- term, NSVD, 8lb 8oz     Patient's last menstrual period was 05/12/2012.      Past Medical History  Diagnosis Date  . Infection   . UTI (lower urinary tract infection)   . Yeast infection     Past Surgical History  Procedure Laterality Date  . No past surgeries      Family History  Problem Relation Age of Onset  . Diabetes Mother     History  Substance Use Topics  . Smoking status: Never Smoker   . Smokeless tobacco: Never Used  . Alcohol Use: No     No Known Allergies  Prescriptions prior to admission  Medication Sig Dispense Refill  . Acetaminophen (TYLENOL PO) Take 1 tablet by mouth every 6 (six) hours as needed.      . Prenatal Vit-Fe Fumarate-FA (PRENATAL MULTIVITAMIN) TABS tablet Take 1 tablet by mouth daily at 12 noon.      . SUMAtriptan (IMITREX) 100 MG tablet Take 1 tablet (100 mg total) by mouth once as needed for migraine.  May repeat in 2 hours if headache persists or recurs.  9 tablet  11    Review of Systems - Negative except for what is mentioned in HPI.  Physical Exam  Blood pressure 107/71, pulse 124, temperature 99.2 F (37.3 C), temperature source Oral, resp. rate 18, height 5\' 2"  (1.575 m), weight 63.322 kg (139 lb 9.6 oz), last menstrual period 05/12/2012, SpO2 100.00%. GENERAL: Well-developed, well-nourished female in no acute distress. Dry mucous membranes.  HEENT: TMs clear bilaterally.  Oropharynx clear.  +Dix-hallpike to the left with horizontal nystagmus.  LUNGS: Clear to auscultation bilaterally.  HEART: tachycardic. regular rhythm. No murmurs.  ABDOMEN: Soft, nontender, nondistended, gravid.  EXTREMITIES: Nontender, no edema, 2+ distal pulses. Sluggish capillary refill.   FHT:  Baseline rate 130 bpm   Variability moderate  Accelerations present   Decelerations none Contractions: quiet   Labs: No results found for this or any previous visit (from the past 24 hour(s)).  Imaging Studies:  No results found.  Assessment: [name] is  32 y.o. G3P2002 at [redacted]w[redacted]d presents with Decreased Fetal Movement and Shortness of Breath She was found on exam to be tachycardic particularly with walking (upon admission  was 150 and then quickly decreased).  While in the MAU, developed room spinning dizziness. +dix-hallpike as above.  Plans as below.   Plan:  1) decreased FM - reactive NST on the monitor - cat I tracing - still no movement felt by pt - pt now at due date and with visit next week and no postdates testing. As such, will send today for BPP - BPP of 8/8  - reassurance given - kick counts discussed  2) shortness of breath - lungs clear bilaterally and on discussion, struggles more with taking in a deep breath but does not describe any wheezing, orthopnea, pnd, or other symptoms.  - suspect it is more related to position of baby - pt comfortable in bed and does not appear to be having  difficulty - given 2L LR bolus for tachycardia and improved her SOB completely. Unclear as to mechanism but at time of discahrge, pt states her symptoms of dyspnea were resolved.   3) tachycardia  - unclear etiology. Likely related to dehydration with spec gravity of >1.0030 and hx of multiple caffeinated beverages daily until recently and very little other substances. Also could be related to caffeine withdrawal.  Autonomic dysfunction considered but less likely - orthostatics positive with increase in HR from 89 when laying to 138 when standing. No change with walking.  EKG showing sinus tachycardia  - cbc showing anemia with hgb of 8.2 but after fluids so proportion is likely dilutional.  - after 2L of bolus, HR improved to 110  - encouraged po hydration on a regular basis. - start daily iron   - has appt next week to be seen and recommend it be checked at that time   4) dizziness - acute onset in the MAU - pos dix-hallpike to the left - hx of vertigo in previous pregnancy - likely vertigo again - - discussed conservative management and home exercises for rehab - handout with exercises given   5) f/u in 1 week as scheduled in clinic. Discussed induction time frame as well for her. Pt discharged home today as no indication for induction.   Case discussed with Dr. Jolayne Panther.    Katielynn Horan L 11/24/20141:54 PM

## 2013-02-18 NOTE — MAU Provider Note (Signed)
Attestation of Attending Supervision of Advanced Practitioner (CNM/NP): Evaluation and management procedures were performed by the Advanced Practitioner under my supervision and collaboration.  I have reviewed the Advanced Practitioner's note and chart, and I agree with the management and plan.  Carlen Fils 02/18/2013 10:31 AM

## 2013-02-24 ENCOUNTER — Ambulatory Visit (INDEPENDENT_AMBULATORY_CARE_PROVIDER_SITE_OTHER): Payer: Medicaid Other | Admitting: Advanced Practice Midwife

## 2013-02-24 ENCOUNTER — Encounter: Payer: Self-pay | Admitting: Advanced Practice Midwife

## 2013-02-24 VITALS — BP 119/77 | Wt 138.9 lb

## 2013-02-24 DIAGNOSIS — O0930 Supervision of pregnancy with insufficient antenatal care, unspecified trimester: Secondary | ICD-10-CM

## 2013-02-24 DIAGNOSIS — O480 Post-term pregnancy: Secondary | ICD-10-CM

## 2013-02-24 DIAGNOSIS — Z3490 Encounter for supervision of normal pregnancy, unspecified, unspecified trimester: Secondary | ICD-10-CM

## 2013-02-24 DIAGNOSIS — O0932 Supervision of pregnancy with insufficient antenatal care, second trimester: Secondary | ICD-10-CM

## 2013-02-24 DIAGNOSIS — Z8759 Personal history of other complications of pregnancy, childbirth and the puerperium: Secondary | ICD-10-CM

## 2013-02-24 DIAGNOSIS — Z8742 Personal history of other diseases of the female genital tract: Secondary | ICD-10-CM

## 2013-02-24 LAB — POCT URINALYSIS DIP (DEVICE)
Bilirubin Urine: NEGATIVE
Glucose, UA: NEGATIVE mg/dL
Ketones, ur: NEGATIVE mg/dL
Nitrite: NEGATIVE
Protein, ur: NEGATIVE mg/dL
Specific Gravity, Urine: 1.025 (ref 1.005–1.030)
Urobilinogen, UA: 1 mg/dL (ref 0.0–1.0)
pH: 6.5 (ref 5.0–8.0)

## 2013-02-24 LAB — OB RESULTS CONSOLE GC/CHLAMYDIA
Chlamydia: NEGATIVE
Gonorrhea: NEGATIVE

## 2013-02-24 NOTE — Progress Notes (Signed)
P=134 Clear discharge, desires induction of labor. NST

## 2013-02-24 NOTE — Progress Notes (Signed)
Ready to be induced. Irregular contractions. IOL scheduled 02/25/13 at 1930hrs.  Probably Foley + Pitocin   GBS and cultures done (found to have been missed due to [employer])

## 2013-02-24 NOTE — Patient Instructions (Signed)

## 2013-02-25 ENCOUNTER — Inpatient Hospital Stay (HOSPITAL_COMMUNITY)
Admission: RE | Admit: 2013-02-25 | Discharge: 2013-02-28 | DRG: 775 | Disposition: A | Discharge status: Home / Self Care | Payer: Medicaid Other | Source: Ambulatory Visit | Attending: Obstetrics and Gynecology | Admitting: Obstetrics and Gynecology

## 2013-02-25 ENCOUNTER — Encounter (HOSPITAL_COMMUNITY): Payer: Self-pay | Admitting: *Deleted

## 2013-02-25 ENCOUNTER — Telehealth (HOSPITAL_COMMUNITY): Payer: Self-pay | Admitting: *Deleted

## 2013-02-25 ENCOUNTER — Encounter (HOSPITAL_COMMUNITY): Payer: Self-pay

## 2013-02-25 VITALS — BP 103/67 | HR 80 | Temp 97.8°F | Resp 18 | Ht 61.0 in | Wt 140.0 lb

## 2013-02-25 DIAGNOSIS — O480 Post-term pregnancy: Principal | ICD-10-CM | POA: Diagnosis present

## 2013-02-25 DIAGNOSIS — O0932 Supervision of pregnancy with insufficient antenatal care, second trimester: Secondary | ICD-10-CM

## 2013-02-25 LAB — CBC
HCT: 29.6 % — ABNORMAL LOW (ref 36.0–46.0)
MCH: 20.1 pg — ABNORMAL LOW (ref 26.0–34.0)
MCV: 64.1 fL — ABNORMAL LOW (ref 78.0–100.0)
RBC: 4.62 MIL/uL (ref 3.87–5.11)
RDW: 18.3 % — ABNORMAL HIGH (ref 11.5–15.5)
WBC: 7.6 10*3/uL (ref 4.0–10.5)

## 2013-02-25 LAB — GC/CHLAMYDIA PROBE AMP: CT Probe RNA: NEGATIVE

## 2013-02-25 MED ORDER — LIDOCAINE HCL (PF) 1 % IJ SOLN
30.0000 mL | INTRAMUSCULAR | Status: DC | PRN
  Filled 2013-02-25 (×2): qty 30

## 2013-02-25 MED ORDER — CITRIC ACID-SODIUM CITRATE 334-500 MG/5ML PO SOLN
30.0000 mL | ORAL | Status: DC | PRN
  Administered 2013-02-26: 30 mL via ORAL
  Filled 2013-02-25: qty 15

## 2013-02-25 MED ORDER — OXYTOCIN BOLUS FROM INFUSION: 500.0000 mL | INTRAVENOUS | Status: DC

## 2013-02-25 MED ORDER — OXYTOCIN 40 UNITS IN LACTATED RINGERS INFUSION - SIMPLE MED
62.5000 mL/h | INTRAVENOUS | Status: DC
  Filled 2013-02-25: qty 1000

## 2013-02-25 MED ORDER — MISOPROSTOL 25 MCG QUARTER TABLET
25.0000 ug | ORAL_TABLET | ORAL | Status: DC | PRN
  Administered 2013-02-25 – 2013-02-26 (×2): 25 ug via VAGINAL
  Filled 2013-02-25: qty 1
  Filled 2013-02-25 (×2): qty 0.25

## 2013-02-25 MED ORDER — IBUPROFEN 600 MG PO TABS
600.0000 mg | ORAL_TABLET | Freq: Four times a day (QID) | ORAL | Status: DC | PRN
  Administered 2013-02-27: 600 mg via ORAL
  Filled 2013-02-25: qty 1

## 2013-02-25 MED ORDER — LACTATED RINGERS IV SOLN
INTRAVENOUS | Status: DC
  Administered 2013-02-25 – 2013-02-26 (×4): via INTRAVENOUS

## 2013-02-25 MED ORDER — TERBUTALINE SULFATE 1 MG/ML IJ SOLN: 0.2500 mg | Freq: Once | INTRAMUSCULAR | Status: AC | PRN

## 2013-02-25 MED ORDER — ONDANSETRON HCL 4 MG/2ML IJ SOLN
4.0000 mg | Freq: Four times a day (QID) | INTRAMUSCULAR | Status: DC | PRN
  Administered 2013-02-25 – 2013-02-27 (×2): 4 mg via INTRAVENOUS
  Filled 2013-02-25 (×2): qty 2

## 2013-02-25 MED ORDER — OXYCODONE-ACETAMINOPHEN 5-325 MG PO TABS
1.0000 | ORAL_TABLET | ORAL | Status: DC | PRN
  Administered 2013-02-27: 1 via ORAL
  Filled 2013-02-25: qty 1

## 2013-02-25 MED ORDER — ACETAMINOPHEN 325 MG PO TABS
650.0000 mg | ORAL_TABLET | ORAL | Status: DC | PRN
  Administered 2013-02-26: 650 mg via ORAL
  Filled 2013-02-25: qty 2

## 2013-02-25 MED ORDER — LACTATED RINGERS IV SOLN
500.0000 mL | INTRAVENOUS | Status: DC | PRN
  Administered 2013-02-26 (×2): 500 mL via INTRAVENOUS

## 2013-02-25 NOTE — Telephone Encounter (Signed)
Preadmission screen  

## 2013-02-25 NOTE — H&P (Signed)
Otilia Kareem is a 32 y.o. female presenting for IOL for postdates. Complains of occassional dizziness and feeling like her heart is racing when laying flat. Normal fetal movement no lof no vb no ctx.   Prenatal care signficant for late care and missed her last appointment but Otherwise uncomplicated.  Marland Kitchen History OB History   Grav Para Term Preterm Abortions TAB SAB Ect Mult Living   3 2 2       2      Past Medical History  Diagnosis Date  . Infection   . UTI (lower urinary tract infection)   . Yeast infection    Past Surgical History  Procedure Laterality Date  . No past surgeries     Family History: family history includes Diabetes in her mother. Social History:  reports that she has never smoked. She has never used smokeless tobacco. She reports that she does not drink alcohol or use illicit drugs.  [facility]  Clinic  Low Risk  Dating LMP/Ultrasound:  23 weeks        Ultrasound consistent with LMP: Yes  Genetic Screen 1 Screen/Quad:    Too Late  Anatomic [place]  Normal  Glucose Screen  1 hr GTT 99  TDaP vaccine 12/23/2012  Flu vaccine  01-06-13  GBS  Missed at term due to [facility], done at 41.1wks (12/2)  Baby Food  Breast  Contraception  Mirena  Pediatrician [facility]    Prenatal Transfer Tool  Maternal Diabetes: No Genetic Screening: Normal Maternal Ultrasounds/Referrals: Normal Fetal Ultrasounds or other Referrals:  None Maternal Substance Abuse:  No Significant Maternal Medications:  Meds include: Other: imitrex Significant Maternal Lab Results:  GBS pending Other Comments:  None  ROS Dizziness and palpitations when laying flat. No headaches vision changes, LOC, no CP, no n/v, no d/c, urinary symptoms or other complaints Dilation: 2 Effacement (%): 60 Station: -3 Exam by:: Dr. Ike Bene Blood pressure 121/88, pulse 119, temperature 97.9 F (36.6 C), temperature source Oral, resp. rate 18, height 5\' 1"  (1.549 m), weight 63.504 kg (140 lb), last menstrual period  05/12/2012, SpO2 97.00%. Exam Physical Exam  VSS NAD Tachycadic, no m/g CTAB no wrc Gravid Leopold 3000g No c/c/e  Dilation: 2 Effacement (%): 60 Station: -3 Presentation: Vertex Exam by:: Dr. Ike Bene    Prenatal labs: ABO, Rh: O/POS/-- (07/16 1008) Antibody: NEG (07/16 1008) Rubella: 18.00 (07/16 1008) RPR: NON REAC (07/16 1008)  HBsAg: NEGATIVE (07/16 1008)  HIV: NON REACTIVE (07/16 1008)  GBS:     Assessment/Plan: Kayda Allers is a 32 y.o. G3P2002 at [redacted]w[redacted]d  here for IOL for postdates at 110w2d #Labor: will start with cytotec #Pain: Desires IV pain meds and epidural #FWB: Cat I #ID:  GBS pending from yesterday, will follow up in AM,  #MOF: breast #MOC:mirena  Karanveer Ramakrishnan RYAN 02/25/2013, 9:00 PM

## 2013-02-26 ENCOUNTER — Encounter (HOSPITAL_COMMUNITY): Payer: Medicaid Other | Admitting: Anesthesiology

## 2013-02-26 ENCOUNTER — Encounter (HOSPITAL_COMMUNITY): Payer: Self-pay

## 2013-02-26 ENCOUNTER — Inpatient Hospital Stay (HOSPITAL_COMMUNITY): Payer: Medicaid Other | Admitting: Anesthesiology

## 2013-02-26 DIAGNOSIS — O480 Post-term pregnancy: Secondary | ICD-10-CM | POA: Diagnosis present

## 2013-02-26 LAB — TYPE AND SCREEN
ABO/RH(D): O POS
Antibody Screen: NEGATIVE

## 2013-02-26 LAB — RPR: RPR Ser Ql: NONREACTIVE

## 2013-02-26 LAB — CULTURE, BETA STREP (GROUP B ONLY)

## 2013-02-26 LAB — ABO/RH: ABO/RH(D): O POS

## 2013-02-26 MED ORDER — MISOPROSTOL 200 MCG PO TABS
800.0000 ug | ORAL_TABLET | Freq: Once | ORAL | Status: AC
  Administered 2013-02-26: 800 ug via RECTAL
  Filled 2013-02-26: qty 4

## 2013-02-26 MED ORDER — FENTANYL 2.5 MCG/ML BUPIVACAINE 1/10 % EPIDURAL INFUSION (WH - ANES)
14.0000 mL/h | INTRAMUSCULAR | Status: DC | PRN
  Administered 2013-02-26: 14 mL/h via EPIDURAL
  Filled 2013-02-26 (×2): qty 125

## 2013-02-26 MED ORDER — PHENYLEPHRINE 40 MCG/ML (10ML) SYRINGE FOR IV PUSH (FOR BLOOD PRESSURE SUPPORT)
80.0000 ug | PREFILLED_SYRINGE | INTRAVENOUS | Status: DC | PRN
  Filled 2013-02-26: qty 2

## 2013-02-26 MED ORDER — OXYTOCIN 40 UNITS IN LACTATED RINGERS INFUSION - SIMPLE MED
1.0000 m[IU]/min | INTRAVENOUS | Status: DC
  Administered 2013-02-26: 2 m[IU]/min via INTRAVENOUS

## 2013-02-26 MED ORDER — EPHEDRINE 5 MG/ML INJ
10.0000 mg | INTRAVENOUS | Status: DC | PRN
  Filled 2013-02-26: qty 4
  Filled 2013-02-26: qty 2

## 2013-02-26 MED ORDER — FAMOTIDINE 20 MG PO TABS
10.0000 mg | ORAL_TABLET | Freq: Every day | ORAL | Status: DC
  Administered 2013-02-26: 10 mg via ORAL
  Filled 2013-02-26: qty 1

## 2013-02-26 MED ORDER — LACTATED RINGERS IV SOLN: 500.0000 mL | Freq: Once | INTRAVENOUS | Status: DC

## 2013-02-26 MED ORDER — EPHEDRINE 5 MG/ML INJ
10.0000 mg | INTRAVENOUS | Status: DC | PRN
  Filled 2013-02-26: qty 2

## 2013-02-26 MED ORDER — PHENYLEPHRINE 40 MCG/ML (10ML) SYRINGE FOR IV PUSH (FOR BLOOD PRESSURE SUPPORT)
80.0000 ug | PREFILLED_SYRINGE | INTRAVENOUS | Status: DC | PRN
  Filled 2013-02-26: qty 10
  Filled 2013-02-26: qty 2

## 2013-02-26 MED ORDER — FENTANYL 2.5 MCG/ML BUPIVACAINE 1/10 % EPIDURAL INFUSION (WH - ANES)
INTRAMUSCULAR | Status: DC | PRN
Start: 2013-02-26 — End: 2013-02-26
  Administered 2013-02-26: 14 mL/h via EPIDURAL

## 2013-02-26 MED ORDER — DIPHENHYDRAMINE HCL 50 MG/ML IJ SOLN: 12.5000 mg | INTRAMUSCULAR | Status: DC | PRN

## 2013-02-26 MED ORDER — FENTANYL CITRATE 0.05 MG/ML IJ SOLN
100.0000 ug | INTRAMUSCULAR | Status: DC | PRN
  Administered 2013-02-26 (×2): 100 ug via INTRAVENOUS
  Filled 2013-02-26 (×2): qty 2

## 2013-02-26 MED ORDER — LIDOCAINE HCL (PF) 1 % IJ SOLN
INTRAMUSCULAR | Status: DC | PRN
  Administered 2013-02-26: 7 mL
  Administered 2013-02-26: 9 mL

## 2013-02-26 NOTE — Progress Notes (Signed)
[name] is a 32 y.o. G3P2002 at [redacted]w[redacted]d by LMP admitted for induction of labor due to Post dates.  Subjective: Pt with no complaints at this time. Cytotec for IOL  Objective: BP 112/80  Pulse 112  Temp(Src) 98.4 F (36.9 C) (Oral)  Resp 20  Ht 5\' 1"  (1.549 m)  Wt 63.504 kg (140 lb)  BMI 26.47 kg/m2  SpO2 97%  LMP 05/12/2012      FHT:  FHR: 130s bpm, variability: moderate,  accelerations:  Present,  decelerations:  Absent UC:   regular, every 3 minutes SVE:   Dilation: 4 Effacement (%): 50 Station: -2 Exam by:: Dr. Ike Bene  Labs: Lab Results  Component Value Date   WBC 7.6 02/25/2013   HGB 9.3* 02/25/2013   HCT 29.6* 02/25/2013   MCV 64.1* 02/25/2013   PLT 234 02/25/2013    Assessment / Plan: Induction of labor due to postterm,  progressing well on pitocin  Labor: Now making cervical change to 4 cm after cytotec, will start pitocin for augmentation Preeclampsia:  no signs or symptoms of toxicity Fetal Wellbeing:  Category I Pain Control:  Epidural, Fentanyl and as needed I/D:  GBS Pending, if not back in AM, will perform rapid, but will not treat based on risk factors Anticipated MOD:  NSVD  [name] Wafer [name] 02/26/2013, 6:16 AM

## 2013-02-26 NOTE — Anesthesia Preprocedure Evaluation (Signed)
Anesthesia Evaluation  Patient identified by MRN, date of birth, ID band Patient awake    Reviewed: Allergy & Precautions, H&P , Patient's Chart, lab work & pertinent test results  Airway Mallampati: I TM Distance: >3 FB Neck ROM: full    Dental no notable dental hx.    Pulmonary neg pulmonary ROS,    Pulmonary exam normal       Cardiovascular negative cardio ROS      Neuro/Psych negative neurological ROS  negative psych ROS   GI/Hepatic negative GI ROS, Neg liver ROS,   Endo/Other  negative endocrine ROS  Renal/GU negative Renal ROS  negative genitourinary   Musculoskeletal negative musculoskeletal ROS (+)   Abdominal Normal abdominal exam  (+)   Peds  Hematology negative hematology ROS (+)   Anesthesia Other Findings   Reproductive/Obstetrics (+) Pregnancy                           Anesthesia Physical Anesthesia Plan  ASA: II  Anesthesia Plan: Epidural   Post-op Pain Management:    Induction:   Airway Management Planned:   Additional Equipment:   Intra-op Plan:   Post-operative Plan:   Informed Consent: I have reviewed the patients History and Physical, chart, labs and discussed the procedure including the risks, benefits and alternatives for the proposed anesthesia with the patient or authorized representative who has indicated his/her understanding and acceptance.     Plan Discussed with:   Anesthesia Plan Comments:         Anesthesia Quick Evaluation  

## 2013-02-26 NOTE — Progress Notes (Signed)
[name] is a 32 y.o. G3P2002 at [redacted]w[redacted]d admitted for IOL for postdates  Subjective: Comfortable s/p epidural.  +FM.    Objective: BP 103/73  Pulse 90  Temp(Src) 98.3 F (36.8 C) (Oral)  Resp 18  Ht 5\' 1"  (1.549 m)  Wt 63.504 kg (140 lb)  BMI 26.47 kg/m2  SpO2 98%  LMP 05/12/2012      FHT:  FHR: 110 bpm, variability: min to moderate,  accelerations:  Abscent,  decelerations:  Absent UC:   regular, every 1-2 minutes SVE:   Dilation: 5 Effacement (%): 80 Station: -2 Exam by:: dr Reola Calkins  Labs: Lab Results  Component Value Date   WBC 7.6 02/25/2013   HGB 9.3* 02/25/2013   HCT 29.6* 02/25/2013   MCV 64.1* 02/25/2013   PLT 234 02/25/2013    Assessment / Plan: IOl for postdates. progressing and currently just on 90mu/min of pit due to contraction pattern.   Labor: cont to increase pit if tolerated. likely plan to AROM after that Fetal Wellbeing:  Category II Pain Control:  Epidural I/D:  n/a Anticipated MOD:  NSVD  [name] L 02/26/2013, 3:59 PM

## 2013-02-26 NOTE — Progress Notes (Signed)
Dr Jarvis Newcomer called - new order for fentanyl

## 2013-02-26 NOTE — Progress Notes (Signed)
[name] is a 32 y.o. G3P2002 at [redacted]w[redacted]d admitted for induction of labor due to postdates.  Subjective:  Has been sleeping most of the afternoon.  Still comfortable with epidural. +FM.   Objective: BP 128/81  Pulse 100  Temp(Src) 98.7 F (37.1 C) (Oral)  Resp 20  Ht 5\' 1"  (1.549 m)  Wt 63.504 kg (140 lb)  BMI 26.47 kg/m2  SpO2 98%  LMP 05/12/2012      FHT:  FHR: 120 bpm, variability: moderate,  accelerations:  Present,  decelerations:  Absent UC:   regular, every 1-2 minutes SVE:   Dilation: 5.5 Effacement (%): 90 Station: -1 Exam by:: dr Reola Calkins  Labs: Lab Results  Component Value Date   WBC 7.6 02/25/2013   HGB 9.3* 02/25/2013   HCT 29.6* 02/25/2013   MCV 64.1* 02/25/2013   PLT 234 02/25/2013    Assessment / Plan: IOL due to postdates  Labor: slow progress. only on 45mu/min of pit due to tachysystole. AROM performed with return of clear fluid.  Fetal Wellbeing:  Category I Pain Control:  Epidural I/D:  n/a Anticipated MOD:  NSVD  [name] 02/26/2013, 6:50 PM

## 2013-02-26 NOTE — Progress Notes (Signed)
[name] is a 32 y.o. G3P2002 at [redacted]w[redacted]d by LMP admitted for induction of labor due to Post dates. Due date 02/16/13.  Subjective: Sleeping comfortably but complains of pain upon wakening. Would like to start epidural. No other complaints.   Objective: BP 100/60  Pulse 81  Temp(Src) 98.3 F (36.8 C) (Oral)  Resp 18  Ht 5\' 1"  (1.549 m)  Wt 63.504 kg (140 lb)  BMI 26.47 kg/m2  SpO2 97%  LMP 05/12/2012      FHT:  FHR: 120 bpm, variability: moderate,  accelerations:  Present,  decelerations:  Absent UC:   regular, every 2-3 minutes SVE:   Dilation: 4 Effacement (%): 70 Station: -2 Exam by:: Shanda Bumps pior md  Labs: Lab Results  Component Value Date   WBC 7.6 02/25/2013   HGB 9.3* 02/25/2013   HCT 29.6* 02/25/2013   MCV 64.1* 02/25/2013   PLT 234 02/25/2013    Assessment / Plan: Induction of labor due to postterm,  progressing well on pitocin, now @ 4 with no dilation but increased effacement.  Labor: Progressing on Pitocin, will continue to increase then AROM Preeclampsia:  labs stable Fetal Wellbeing:  Category I Pain Control:  Epidural and Fentanyl I/D:  GBS neg Anticipated MOD:  NSVD  Pior, Jearld Lesch 02/26/2013, 10:24 AM  I have seen and examined this patient and agree with above documentation in the resident's note.   Rulon Abide, M.D. [facility] Fellow 02/26/2013 11:29 AM

## 2013-02-26 NOTE — H&P (Signed)
Attestation of Attending Supervision of Advanced Practitioner: Evaluation and management procedures were performed by the PA/NP/CNM/OB Fellow under my supervision/collaboration. Chart reviewed and agree with management and plan.  Piya Mesch V 02/26/2013 5:42 AM

## 2013-02-26 NOTE — Anesthesia Procedure Notes (Signed)
Epidural Patient location during procedure: OB Start time: 02/26/2013 12:24 PM End time: 02/26/2013 12:28 PM  Staffing Anesthesiologist: Leilani Able Performed by: anesthesiologist   Preanesthetic Checklist Completed: patient identified, surgical consent, pre-op evaluation, timeout performed, IV checked, risks and benefits discussed and monitors and equipment checked  Epidural Patient position: sitting Prep: site prepped and draped and DuraPrep Patient monitoring: continuous pulse ox and blood pressure Approach: midline Injection technique: LOR air  Needle:  Needle type: Tuohy  Needle gauge: 17 G Needle length: 9 cm and 9 Needle insertion depth: 5 cm cm Catheter type: closed end flexible Catheter size: 19 Gauge Catheter at skin depth: 10 cm Test dose: negative and Other  Assessment Sensory level: T9 Events: blood not aspirated, injection not painful, no injection resistance, negative IV test and no paresthesia  Additional Notes Reason for block:procedure for pain

## 2013-02-26 NOTE — Progress Notes (Signed)
[name] is a 32 y.o. G3P2002 at [redacted]w[redacted]d by LMP admitted for induction of labor due to Post dates.  Subjective: Pt with no complaints at this time. Cytotec for IOL  Objective: BP 105/66  Pulse 98  Temp(Src) 98.9 F (37.2 C) (Oral)  Resp 18  Ht 5\' 1"  (1.549 m)  Wt 63.504 kg (140 lb)  BMI 26.47 kg/m2  SpO2 97%  LMP 05/12/2012      FHT:  FHR: 120s bpm, variability: moderate,  accelerations:  Present,  decelerations:  Present Deep decel to 90s for 1 min with quick resolution. no recurrance UC:   regular, every 3 minutes SVE:   Dilation: 2 Effacement (%): 60 Station: -3 Exam by:: Dr. Ike Bene  Labs: Lab Results  Component Value Date   WBC 7.6 02/25/2013   HGB 9.3* 02/25/2013   HCT 29.6* 02/25/2013   MCV 64.1* 02/25/2013   PLT 234 02/25/2013    Assessment / Plan: Induction of labor due to postterm,  progressing well on pitocin  Labor: Progressing normally and will continue cytotec unless making significant change. Preeclampsia:  no signs or symptoms of toxicity Fetal Wellbeing:  Category II Pain Control:  Epidural, Fentanyl and as needed I/D:  GBS Pending, if not back in AM, will perform rapid, but will not treat based on risk factors Anticipated MOD:  NSVD  [name] RYAN 02/26/2013, 12:02 AM

## 2013-02-27 ENCOUNTER — Encounter (HOSPITAL_COMMUNITY): Payer: Self-pay

## 2013-02-27 DIAGNOSIS — O480 Post-term pregnancy: Secondary | ICD-10-CM

## 2013-02-27 LAB — CBC
HCT: 27.3 % — ABNORMAL LOW (ref 36.0–46.0)
Hemoglobin: 8.5 g/dL — ABNORMAL LOW (ref 12.0–15.0)
MCH: 20.2 pg — ABNORMAL LOW (ref 26.0–34.0)
MCHC: 31.1 g/dL (ref 30.0–36.0)
MCV: 64.8 fL — ABNORMAL LOW (ref 78.0–100.0)
RDW: 17.3 % — ABNORMAL HIGH (ref 11.5–15.5)
WBC: 11.5 10*3/uL — ABNORMAL HIGH (ref 4.0–10.5)

## 2013-02-27 MED ORDER — METHYLERGONOVINE MALEATE 0.2 MG/ML IJ SOLN
0.2000 mg | Freq: Once | INTRAMUSCULAR | Status: AC
  Administered 2013-02-27: 0.2 mg via INTRAMUSCULAR

## 2013-02-27 MED ORDER — PRENATAL MULTIVITAMIN CH
1.0000 | ORAL_TABLET | Freq: Every day | ORAL | Status: DC
  Administered 2013-02-28: 1 via ORAL
  Filled 2013-02-27: qty 1

## 2013-02-27 MED ORDER — ZOLPIDEM TARTRATE 5 MG PO TABS: 5.0000 mg | ORAL_TABLET | Freq: Every evening | ORAL | Status: DC | PRN

## 2013-02-27 MED ORDER — METHYLERGONOVINE MALEATE 0.2 MG/ML IJ SOLN: 0.2000 mg | INTRAMUSCULAR | Status: DC | PRN

## 2013-02-27 MED ORDER — MEASLES, MUMPS & RUBELLA VAC ~~LOC~~ INJ
0.5000 mL | INJECTION | Freq: Once | SUBCUTANEOUS | Status: DC
  Filled 2013-02-27: qty 0.5

## 2013-02-27 MED ORDER — METHYLERGONOVINE MALEATE 0.2 MG/ML IJ SOLN
INTRAMUSCULAR | Status: AC
  Filled 2013-02-27: qty 1

## 2013-02-27 MED ORDER — SENNOSIDES-DOCUSATE SODIUM 8.6-50 MG PO TABS
2.0000 | ORAL_TABLET | ORAL | Status: DC
  Administered 2013-02-27: 2 via ORAL
  Filled 2013-02-27: qty 2

## 2013-02-27 MED ORDER — TETANUS-DIPHTH-ACELL PERTUSSIS 5-2.5-18.5 LF-MCG/0.5 IM SUSP
0.5000 mL | Freq: Once | INTRAMUSCULAR | Status: DC
  Filled 2013-02-27: qty 0.5

## 2013-02-27 MED ORDER — FLEET ENEMA 7-19 GM/118ML RE ENEM: 1.0000 | ENEMA | Freq: Every day | RECTAL | Status: DC | PRN

## 2013-02-27 MED ORDER — DIBUCAINE 1 % RE OINT
1.0000 "application " | TOPICAL_OINTMENT | RECTAL | Status: DC | PRN
  Administered 2013-02-27: 1 via RECTAL
  Filled 2013-02-27: qty 28

## 2013-02-27 MED ORDER — OXYTOCIN 40 UNITS IN LACTATED RINGERS INFUSION - SIMPLE MED
250.0000 mL/h | INTRAVENOUS | Status: DC
  Administered 2013-02-27: 250 mL/h via INTRAVENOUS

## 2013-02-27 MED ORDER — BENZOCAINE-MENTHOL 20-0.5 % EX AERO
1.0000 "application " | INHALATION_SPRAY | CUTANEOUS | Status: DC | PRN
  Administered 2013-02-27: 1 via TOPICAL
  Filled 2013-02-27: qty 56

## 2013-02-27 MED ORDER — SIMETHICONE 80 MG PO CHEW: 80.0000 mg | CHEWABLE_TABLET | ORAL | Status: DC | PRN

## 2013-02-27 MED ORDER — OXYCODONE-ACETAMINOPHEN 5-325 MG PO TABS
1.0000 | ORAL_TABLET | ORAL | Status: DC | PRN
  Administered 2013-02-27 (×4): 1 via ORAL
  Filled 2013-02-27 (×4): qty 1

## 2013-02-27 MED ORDER — IBUPROFEN 600 MG PO TABS
600.0000 mg | ORAL_TABLET | Freq: Four times a day (QID) | ORAL | Status: DC
  Administered 2013-02-27 – 2013-02-28 (×6): 600 mg via ORAL
  Filled 2013-02-27 (×6): qty 1

## 2013-02-27 MED ORDER — LANOLIN HYDROUS EX OINT: TOPICAL_OINTMENT | CUTANEOUS | Status: DC | PRN

## 2013-02-27 MED ORDER — CARBOPROST TROMETHAMINE 250 MCG/ML IM SOLN
INTRAMUSCULAR | Status: AC
  Filled 2013-02-27: qty 1

## 2013-02-27 MED ORDER — INTEGRA 62.5-62.5-40-3 MG PO CAPS: 1.0000 | ORAL_CAPSULE | Freq: Every day | ORAL | Status: DC

## 2013-02-27 MED ORDER — ONDANSETRON HCL 4 MG/2ML IJ SOLN: 4.0000 mg | INTRAMUSCULAR | Status: DC | PRN

## 2013-02-27 MED ORDER — DIPHENHYDRAMINE HCL 25 MG PO CAPS: 25.0000 mg | ORAL_CAPSULE | Freq: Four times a day (QID) | ORAL | Status: DC | PRN

## 2013-02-27 MED ORDER — OXYTOCIN 40 UNITS IN LACTATED RINGERS INFUSION - SIMPLE MED
INTRAVENOUS | Status: AC
  Filled 2013-02-27: qty 1000

## 2013-02-27 MED ORDER — MISOPROSTOL 200 MCG PO TABS
ORAL_TABLET | ORAL | Status: AC
  Filled 2013-02-27: qty 4

## 2013-02-27 MED ORDER — ONDANSETRON HCL 4 MG PO TABS: 4.0000 mg | ORAL_TABLET | ORAL | Status: DC | PRN

## 2013-02-27 MED ORDER — WITCH HAZEL-GLYCERIN EX PADS
1.0000 "application " | MEDICATED_PAD | CUTANEOUS | Status: DC | PRN
  Administered 2013-02-27: 1 via TOPICAL

## 2013-02-27 MED ORDER — FERROUS SULFATE 325 (65 FE) MG PO TABS
325.0000 mg | ORAL_TABLET | Freq: Two times a day (BID) | ORAL | Status: DC
  Administered 2013-02-27 – 2013-02-28 (×3): 325 mg via ORAL
  Filled 2013-02-27 (×3): qty 1

## 2013-02-27 MED ORDER — METHYLERGONOVINE MALEATE 0.2 MG PO TABS: 0.2000 mg | ORAL_TABLET | ORAL | Status: DC | PRN

## 2013-02-27 MED ORDER — OXYTOCIN 40 UNITS IN LACTATED RINGERS INFUSION - SIMPLE MED: 62.5000 mL/h | INTRAVENOUS | Status: DC | PRN

## 2013-02-27 MED ORDER — FENTANYL CITRATE 0.05 MG/ML IJ SOLN
INTRAMUSCULAR | Status: AC
  Filled 2013-02-27: qty 4

## 2013-02-27 MED ORDER — BISACODYL 10 MG RE SUPP: 10.0000 mg | Freq: Every day | RECTAL | Status: DC | PRN

## 2013-02-27 NOTE — Progress Notes (Signed)
UR chart review completed.  

## 2013-02-27 NOTE — Lactation Note (Signed)
This note was copied from the chart of [name]. Lactation Consultation Note  Patient Name: [name] Byrdie Miyazaki [id number] Date: 02/27/2013 Reason for consult: Initial assessment;Other (Comment) (charting for exclusion)   Maternal Data Formula Feeding for Exclusion: Yes Reason for exclusion: Mother's choice to formula feed on admision  Feeding Feeding Type: Bottle Fed - Formula Nipple Type: Regular  LATCH Score/Interventions                      Lactation Tools Discussed/Used     Consult Status Consult Status: Complete    Lynda Rainwater 02/27/2013, 5:02 PM

## 2013-02-27 NOTE — Progress Notes (Signed)
Post Partum Day 1 Subjective: up ad lib, voiding and + flatus [name] c/o dizziness and her "heart racing" this am which she advised has been the same throughout the later part of her pregnancy and she has been followed for it prior to delivery.  Otherwise she states she feels well, has been up ad lib, urinating and passing flatus without problem.  She denies chest pain, SOB, nausea/vomiting.  She wishes to bottle feed her baby girl, and wishes to do Mirena as MOC.    Objective: Blood pressure 126/81, pulse 102, temperature 99.6 F (37.6 C), temperature source Oral, resp. rate 18, height 5\' 1"  (1.549 m), weight 63.504 kg (140 lb), last menstrual period 05/12/2012, SpO2 99.00%, unknown if currently breastfeeding.  Physical Exam:  General: alert, cooperative and no distress Heart: Regular rate, rhythm.  S1, S2 distinct.  No murmurs or gallops noted.   Lungs: CTA bilaterally Abdomen: SNT, BS active Lochia: appropriate Uterine Fundus: firm DVT Evaluation: No evidence of DVT seen on physical exam. Negative Homan's sign. No cords or calf tenderness. No significant calf/ankle edema.   Recent Labs  02/25/13 2010 02/27/13 0620  HGB 9.3* 8.5*  HCT 29.6* 27.3*    Assessment/Plan: Plan for discharge tomorrow   LOS: 2 days   [name] 02/27/2013, 8:53 AM   I have seen this patient and agree with the above resident's note.  Pt reports some dizziness when standing.  Plan to start Integra for iron replacement in hospital, discharge with Integra for 1 month PP.  LEFTWICH-KIRBY, LISA Certified Nurse-Midwife

## 2013-02-27 NOTE — Anesthesia Postprocedure Evaluation (Signed)
  Anesthesia Post-op Note  Patient: [name]  Procedure(s) Performed: * No procedures listed *  Patient Location: PACU and Mother/Baby  Anesthesia Type:Epidural  Level of Consciousness: awake, alert  and oriented  Airway and Oxygen Therapy: Patient Spontanous Breathing  Post-op Pain: mild  Post-op Assessment: Patient's Cardiovascular Status Stable, Respiratory Function Stable, No signs of Nausea or vomiting, Adequate PO intake, Pain level controlled, No headache, No backache, No residual numbness and No residual motor weakness  Post-op Vital Signs: stable  Complications: No apparent anesthesia complications

## 2013-02-28 MED ORDER — IBUPROFEN 600 MG PO TABS: 600.0000 mg | ORAL_TABLET | Freq: Four times a day (QID) | ORAL | Status: DC | PRN

## 2013-02-28 MED ORDER — FERROUS SULFATE 325 (65 FE) MG PO TABS: 325.0000 mg | ORAL_TABLET | Freq: Two times a day (BID) | ORAL | Status: DC

## 2013-02-28 NOTE — Discharge Summary (Signed)
Obstetric Discharge Summary Reason for Admission: induction of labor d/t postdates Prenatal Procedures: ultrasound Intrapartum Procedures: spontaneous vaginal delivery Postpartum Procedures: none Complications-Operative and Postpartum: none Hemoglobin  Date Value Range Status  02/27/2013 8.5* 12.0 - 15.0 g/dL Final     HCT  Date Value Range Status  02/27/2013 27.3* 36.0 - 46.0 % Final    Physical Exam:  General: alert, cooperative and no distress Lochia: appropriate Uterine Fundus: firm Incision: n/a DVT Evaluation: No evidence of DVT seen on physical exam. Negative Homan's sign. No cords or calf tenderness. No significant calf/ankle edema.  Discharge Diagnoses: Term Pregnancy-delivered  Discharge Information: Date: 02/28/2013 Activity: pelvic rest Diet: routine Medications: PNV, Ibuprofen and Iron Condition: stable Instructions: refer to practice specific booklet Discharge to: home Follow-up Information   Schedule an appointment as soon as possible for a visit with [facility]. (4-6 weeks for your postpartum visit and another for mirena insertion)    Specialty:  Obstetrics and Gynecology   Contact information:   [address] [place] [address] [phone]      Newborn Data: Live born female  Birth Weight: 8 lb 7.3 oz (3835 g) APGAR: 8, 9  Home with mother. Bottlefeeding, Mirena for contraception  Marge Duncans 02/28/2013, 9:34 AM

## 2013-04-06 ENCOUNTER — Encounter: Payer: Self-pay | Admitting: Nurse Practitioner

## 2013-04-06 ENCOUNTER — Ambulatory Visit (INDEPENDENT_AMBULATORY_CARE_PROVIDER_SITE_OTHER): Payer: Medicaid Other | Admitting: Nurse Practitioner

## 2013-04-06 VITALS — BP 111/79 | HR 85 | Temp 98.4°F | Ht 61.0 in | Wt 119.4 lb

## 2013-04-06 DIAGNOSIS — Z30430 Encounter for insertion of intrauterine contraceptive device: Secondary | ICD-10-CM

## 2013-04-06 DIAGNOSIS — Z975 Presence of (intrauterine) contraceptive device: Secondary | ICD-10-CM

## 2013-04-06 DIAGNOSIS — Z01812 Encounter for preprocedural laboratory examination: Secondary | ICD-10-CM

## 2013-04-06 LAB — POCT PREGNANCY, URINE: Preg Test, Ur: NEGATIVE

## 2013-04-06 MED ORDER — IBUPROFEN 600 MG PO TABS: 600.0000 mg | ORAL_TABLET | Freq: Four times a day (QID) | ORAL | Status: DC | PRN

## 2013-04-06 MED ORDER — LEVONORGESTREL 20 MCG/24HR IU IUD
INTRAUTERINE_SYSTEM | Freq: Once | INTRAUTERINE | Status: AC
  Administered 2013-04-06: 1 via INTRAUTERINE

## 2013-04-06 NOTE — Progress Notes (Signed)
Patient ID: [name], female   DOB: November 18, 1980, 33 y.o.   MRN: [id number] History:  [name] is a 33 y.o. [phone] who [facility] care and [name] insertion. She delivered on 02/26/13 after 28 hour labor. She was post dates at 41 weeks and 3 days. She had NSVD without tears. She is doing well with the baby as far as sleeping and bonding. She is formula feeding only. She has not had menses or resumed intercourse. She wants a [name] IUD for birth control.   The following portions of the patient's history were reviewed and updated as appropriate: allergies, current medications, past family history, past medical history, past social history, past surgical history and problem list.  Review of Systems:  Pertinent items are noted in HPI.  Objective:  Physical Exam BP 111/79  Pulse 85  Temp(Src) 98.4 F (36.9 C) (Oral)  Ht 5\' 1"  (1.549 m)  Wt 119 lb 6.4 oz (54.159 kg)  BMI 22.57 kg/m2  Breastfeeding? No GENERAL: Well-developed, well-nourished female in no acute distress.  HEENT: Normocephalic, atraumatic.  NECK: Supple. Normal thyroid.  LUNGS: Normal rate. Clear to auscultation bilaterally.  HEART: Regular rate and rhythm with no adventitious sounds.  ABDOMEN: Soft, nontender, nondistended. No organomegaly. Normal bowel sounds appreciated in all quadrants.  PELVIC: Normal external female genitalia. Vagina is pink and rugated.  Normal discharge. Normal cervix contour.  Uterus is normal in size. No adnexal mass or tenderness.  EXTREMITIES: No cyanosis, clubbing, or edema, 2+ distal pulses.   Labs and Imaging No results found. Results for orders placed in visit on 04/06/13 (from the past 24 hour(s))  POCT PREGNANCY, URINE     Status: None   Collection Time    04/06/13  3:17 PM      Result Value Range   Preg Test, Ur NEGATIVE  NEGATIVE   IUD Procedure Note/ Mirenia  Patient identified, informed consent performed, signed copy in chart,  time out was performed.  Urine pregnancy test negative.  Speculum placed in the vagina.  Cervix visualized.  Cleaned with Betadine x 2.  Grasped anteriorly with a single tooth tenaculum.  Uterus sounded to 6cm.  Mirena IUD placed per manufacturer's recommendations.  Strings trimmed to 3 cm. Tenaculum was removed, good hemostasis noted.  Patient tolerated procedure well.   Patient given post procedure instructions  Patient is asked to check IUD strings periodically and follow up in 4-6 weeks for IUD check.   Assessment & Plan:  Assessment:  Post Partum care Contraception  Plans:  Doing well after delivery with baby [name] placed today Motrin 600 mg po q6 hours for cramping Follow up if pain / bleeding severe RTC 6 week string check  Delbert PhenixLinda M Eliyanah Elgersma, NP 04/06/2013 3:41 PM

## 2013-04-06 NOTE — Patient Instructions (Signed)
Levonorgestrel intrauterine device (IUD) What is this medicine? LEVONORGESTREL IUD (LEE voe nor jes trel) is a contraceptive (birth control) device. The device is placed inside the uterus by a healthcare professional. It is used to prevent pregnancy and can also be used to treat heavy bleeding that occurs during your period. Depending on the device, it can be used for 3 to 5 years. This medicine may be used for other purposes; ask your health care provider or pharmacist if you have questions. COMMON BRAND NAME(S): Mirena, Skyla What should I tell my health care provider before I take this medicine? They need to know if you have any of these conditions: -abnormal Pap smear -cancer of the breast, uterus, or cervix -diabetes -endometritis -genital or pelvic infection now or in the past -have more than one sexual partner or your partner has more than one partner -heart disease -history of an ectopic or tubal pregnancy -immune system problems -IUD in place -liver disease or tumor -problems with blood clots or take blood-thinners -use intravenous drugs -uterus of unusual shape -vaginal bleeding that has not been explained -an unusual or allergic reaction to levonorgestrel, other hormones, silicone, or polyethylene, medicines, foods, dyes, or preservatives -pregnant or trying to get pregnant -breast-feeding How should I use this medicine? This device is placed inside the uterus by a health care professional. Talk to your pediatrician regarding the use of this medicine in children. Special care may be needed. Overdosage: If you think you have taken too much of this medicine contact a poison control center or emergency room at once. NOTE: This medicine is only for you. Do not share this medicine with others. What if I miss a dose? This does not apply. What may interact with this medicine? Do not take this medicine with any of the following  medications: -amprenavir -bosentan -fosamprenavir This medicine may also interact with the following medications: -aprepitant -barbiturate medicines for inducing sleep or treating seizures -bexarotene -griseofulvin -medicines to treat seizures like carbamazepine, ethotoin, felbamate, oxcarbazepine, phenytoin, topiramate -modafinil -pioglitazone -rifabutin -rifampin -rifapentine -some medicines to treat HIV infection like atazanavir, indinavir, lopinavir, nelfinavir, tipranavir, ritonavir -St. John's wort -warfarin This list may not describe all possible interactions. Give your health care provider a list of all the medicines, herbs, non-prescription drugs, or dietary supplements you use. Also tell them if you smoke, drink alcohol, or use illegal drugs. Some items may interact with your medicine. What should I watch for while using this medicine? Visit your doctor or health care professional for regular check ups. See your doctor if you or your partner has sexual contact with others, becomes HIV positive, or gets a sexual transmitted disease. This product does not protect you against HIV infection (AIDS) or other sexually transmitted diseases. You can check the placement of the IUD yourself by reaching up to the top of your vagina with clean fingers to feel the threads. Do not pull on the threads. It is a good habit to check placement after each menstrual period. Call your doctor right away if you feel more of the IUD than just the threads or if you cannot feel the threads at all. The IUD may come out by itself. You may become pregnant if the device comes out. If you notice that the IUD has come out use a backup birth control method like condoms and call your health care provider. Using tampons will not change the position of the IUD and are okay to use during your period. What side effects may I   notice from receiving this medicine? Side effects that you should report to your doctor or  health care professional as soon as possible: -allergic reactions like skin rash, itching or hives, swelling of the face, lips, or tongue -fever, flu-like symptoms -genital sores -high blood pressure -no menstrual period for 6 weeks during use -pain, swelling, warmth in the leg -pelvic pain or tenderness -severe or sudden headache -signs of pregnancy -stomach cramping -sudden shortness of breath -trouble with balance, talking, or walking -unusual vaginal bleeding, discharge -yellowing of the eyes or skin Side effects that usually do not require medical attention (report to your doctor or health care professional if they continue or are bothersome): -acne -breast pain -change in sex drive or performance -changes in weight -cramping, dizziness, or faintness while the device is being inserted -headache -irregular menstrual bleeding within first 3 to 6 months of use -nausea This list may not describe all possible side effects. Call your doctor for medical advice about side effects. You may report side effects to FDA at 1-800-FDA-1088. Where should I keep my medicine? This does not apply. NOTE: This sheet is a summary. It may not cover all possible information. If you have questions about this medicine, talk to your doctor, pharmacist, or health care provider.  2014, Elsevier/Gold Standard. (2011-04-12 13:54:04)  

## 2013-04-15 ENCOUNTER — Encounter: Payer: Self-pay | Admitting: *Deleted

## 2013-05-18 ENCOUNTER — Ambulatory Visit (INDEPENDENT_AMBULATORY_CARE_PROVIDER_SITE_OTHER): Payer: Medicaid Other | Admitting: Obstetrics & Gynecology

## 2013-05-18 ENCOUNTER — Encounter: Payer: Self-pay | Admitting: Obstetrics & Gynecology

## 2013-05-18 VITALS — BP 107/74 | HR 71 | Temp 96.9°F | Ht 61.0 in | Wt 121.3 lb

## 2013-05-18 DIAGNOSIS — Z30431 Encounter for routine checking of intrauterine contraceptive device: Secondary | ICD-10-CM

## 2013-05-18 NOTE — Patient Instructions (Signed)
Return to clinic for any scheduled appointments or for any gynecologic concerns as needed.   

## 2013-05-18 NOTE — Progress Notes (Signed)
   CLINIC ENCOUNTER NOTE  History:  33 y.o. [id number] here today for IUD check, placed 04/06/13.  She feels like it is uncomfortable , "feels like a tampon that should be changed".  Has some irregular bleeding.which she understands is normal for a few months after placement.  The following portions of the patient's history were reviewed and updated as appropriate: allergies, current medications, past family history, past medical history, past social history, past surgical history and problem list.  Normal pap smear on 10/08/12.  Review of Systems:  Pertinent items are noted in HPI.  Objective:  Physical Exam BP 107/74  Pulse 71  Temp(Src) 96.9 F (36.1 C) (Oral)  Ht 5\' 1"  (1.549 m)  Wt 121 lb 4.8 oz (55.021 kg)  BMI 22.93 kg/m2  LMP 04/06/2013  Breastfeeding? No Gen: NAD Abd: Soft, nontender and nondistended Pelvic: Normal appearing external genitalia; normal appearing vaginal mucosa and cervix.  Strings visualized and were long, they were cut to 1 cm length and tucked into ectocervix.  Patient satisfied with this action.  Normal discharge.    Assessment & Plan:  Normal IUD check, strings shortened as per patient's request Routine preventative health maintenance measures emphasized.     Jaynie CollinsUGONNA  Isaul Landi, MD, FACOG Attending Obstetrician & Gynecologist Faculty Practice, [facility]

## 2013-06-09 ENCOUNTER — Emergency Department (HOSPITAL_COMMUNITY): Payer: Medicaid Other

## 2013-06-09 ENCOUNTER — Encounter (HOSPITAL_COMMUNITY): Payer: Self-pay | Admitting: Emergency Medicine

## 2013-06-09 ENCOUNTER — Observation Stay (HOSPITAL_COMMUNITY)
Admission: EM | Admit: 2013-06-09 | Discharge: 2013-06-12 | Disposition: A | Discharge status: Home / Self Care | Payer: Medicaid Other | Attending: General Surgery | Admitting: General Surgery

## 2013-06-09 DIAGNOSIS — IMO0002 Reserved for concepts with insufficient information to code with codable children: Secondary | ICD-10-CM | POA: Insufficient documentation

## 2013-06-09 DIAGNOSIS — S82143A Displaced bicondylar fracture of unspecified tibia, initial encounter for closed fracture: Secondary | ICD-10-CM

## 2013-06-09 DIAGNOSIS — M25519 Pain in unspecified shoulder: Secondary | ICD-10-CM | POA: Insufficient documentation

## 2013-06-09 DIAGNOSIS — S060X0A Concussion without loss of consciousness, initial encounter: Principal | ICD-10-CM | POA: Insufficient documentation

## 2013-06-09 DIAGNOSIS — T148XXA Other injury of unspecified body region, initial encounter: Secondary | ICD-10-CM

## 2013-06-09 DIAGNOSIS — S060XAA Concussion with loss of consciousness status unknown, initial encounter: Secondary | ICD-10-CM | POA: Diagnosis present

## 2013-06-09 DIAGNOSIS — S0081XA Abrasion of other part of head, initial encounter: Secondary | ICD-10-CM | POA: Diagnosis present

## 2013-06-09 DIAGNOSIS — S40019A Contusion of unspecified shoulder, initial encounter: Secondary | ICD-10-CM | POA: Insufficient documentation

## 2013-06-09 DIAGNOSIS — S060X9A Concussion with loss of consciousness of unspecified duration, initial encounter: Secondary | ICD-10-CM | POA: Diagnosis present

## 2013-06-09 DIAGNOSIS — S82141A Displaced bicondylar fracture of right tibia, initial encounter for closed fracture: Secondary | ICD-10-CM | POA: Diagnosis present

## 2013-06-09 DIAGNOSIS — S82109A Unspecified fracture of upper end of unspecified tibia, initial encounter for closed fracture: Secondary | ICD-10-CM | POA: Insufficient documentation

## 2013-06-09 DIAGNOSIS — D649 Anemia, unspecified: Secondary | ICD-10-CM | POA: Insufficient documentation

## 2013-06-09 HISTORY — DX: Other injury of unspecified body region, initial encounter: T14.8XXA

## 2013-06-09 MED ORDER — MORPHINE SULFATE 4 MG/ML IJ SOLN
6.0000 mg | Freq: Once | INTRAMUSCULAR | Status: AC
  Administered 2013-06-10: 6 mg via INTRAVENOUS
  Filled 2013-06-09: qty 2
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
## 2013-06-09 NOTE — ED Notes (Signed)
Bed: [id number] Expected date:  Expected time:  Means of arrival:  Comments: Margo AyeHall A
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
## 2013-06-09 NOTE — ED Notes (Signed)
Bed: [id number] Expected date:  Expected time:  Means of arrival:  Comments: EMS 33yo fall, hx of MS

## 2013-06-10 ENCOUNTER — Emergency Department (HOSPITAL_COMMUNITY): Payer: Medicaid Other

## 2013-06-10 ENCOUNTER — Encounter (HOSPITAL_COMMUNITY): Payer: Self-pay | Admitting: General Practice

## 2013-06-10 DIAGNOSIS — R4182 Altered mental status, unspecified: Secondary | ICD-10-CM

## 2013-06-10 DIAGNOSIS — S82209A Unspecified fracture of shaft of unspecified tibia, initial encounter for closed fracture: Secondary | ICD-10-CM

## 2013-06-10 DIAGNOSIS — S060XAA Concussion with loss of consciousness status unknown, initial encounter: Secondary | ICD-10-CM

## 2013-06-10 DIAGNOSIS — S060X9A Concussion with loss of consciousness of unspecified duration, initial encounter: Secondary | ICD-10-CM

## 2013-06-10 LAB — RAPID URINE DRUG SCREEN, HOSP PERFORMED
Amphetamines: NOT DETECTED
Barbiturates: NOT DETECTED
Benzodiazepines: NOT DETECTED
Cocaine: NOT DETECTED
Opiates: POSITIVE — AB
Tetrahydrocannabinol: NOT DETECTED

## 2013-06-10 LAB — COMPREHENSIVE METABOLIC PANEL
ALBUMIN: 3.9 g/dL (ref 3.5–5.2)
ALK PHOS: 88 U/L (ref 39–117)
ALT: 23 U/L (ref 0–35)
AST: 19 U/L (ref 0–37)
BUN: 14 mg/dL (ref 6–23)
CALCIUM: 9.4 mg/dL (ref 8.4–10.5)
CO2: 27 mEq/L (ref 19–32)
Chloride: 102 mEq/L (ref 96–112)
Creatinine, Ser: 0.69 mg/dL (ref 0.50–1.10)
GFR calc Af Amer: >90 mL/min (ref >90)
GFR calc non Af Amer: >90 mL/min (ref >90)
Glucose, Bld: 115 mg/dL — ABNORMAL HIGH (ref 70–99)
POTASSIUM: 3.8 meq/L (ref 3.7–5.3)
SODIUM: 141 meq/L (ref 137–147)
TOTAL PROTEIN: 7 g/dL (ref 6.0–8.3)
Total Bilirubin: 0.8 mg/dL (ref 0.3–1.2)

## 2013-06-10 LAB — BASIC METABOLIC PANEL
BUN: 13 mg/dL (ref 6–23)
CO2: 26 mEq/L (ref 19–32)
Calcium: 9.2 mg/dL (ref 8.4–10.5)
Chloride: 102 mEq/L (ref 96–112)
Creatinine, Ser: 0.69 mg/dL (ref 0.50–1.10)
GFR calc Af Amer: >90 mL/min (ref >90)
GFR calc non Af Amer: >90 mL/min (ref >90)
Glucose, Bld: 114 mg/dL — ABNORMAL HIGH (ref 70–99)
Potassium: 3.6 mEq/L — ABNORMAL LOW (ref 3.7–5.3)
Sodium: 140 mEq/L (ref 137–147)

## 2013-06-10 LAB — CBC
HCT: 33.6 % — ABNORMAL LOW (ref 36.0–46.0)
HCT: 33.8 % — ABNORMAL LOW (ref 36.0–46.0)
HEMOGLOBIN: 10.9 g/dL — AB (ref 12.0–15.0)
Hemoglobin: 10.9 g/dL — ABNORMAL LOW (ref 12.0–15.0)
MCH: 23.6 pg — ABNORMAL LOW (ref 26.0–34.0)
MCH: 23.9 pg — AB (ref 26.0–34.0)
MCHC: 32.4 g/dL (ref 30.0–36.0)
MCHC: 32.2 g/dL (ref 30.0–36.0)
MCV: 72.7 fL — ABNORMAL LOW (ref 78.0–100.0)
MCV: 74.1 fL — ABNORMAL LOW (ref 78.0–100.0)
PLATELETS: 269 10*3/uL (ref 150–400)
Platelets: 293 10*3/uL (ref 150–400)
RBC: 4.62 MIL/uL (ref 3.87–5.11)
RBC: 4.56 MIL/uL (ref 3.87–5.11)
RDW: 14.6 % (ref 11.5–15.5)
RDW: 14.8 % (ref 11.5–15.5)
WBC: 9 10*3/uL (ref 4.0–10.5)
WBC: 9 10*3/uL (ref 4.0–10.5)

## 2013-06-10 LAB — ETHANOL: Alcohol, Ethyl (B): <11 mg/dL (ref 0–11)

## 2013-06-10 LAB — URINALYSIS, ROUTINE W REFLEX MICROSCOPIC
Bilirubin Urine: NEGATIVE
GLUCOSE, UA: NEGATIVE mg/dL
Hgb urine dipstick: NEGATIVE
KETONES UR: NEGATIVE mg/dL
LEUKOCYTES UA: NEGATIVE
Nitrite: NEGATIVE
PH: 5 (ref 5.0–8.0)
PROTEIN: NEGATIVE mg/dL
Specific Gravity, Urine: 1.015 (ref 1.005–1.030)
Urobilinogen, UA: 0.2 mg/dL (ref 0.0–1.0)

## 2013-06-10 LAB — PREGNANCY, URINE: Preg Test, Ur: NEGATIVE

## 2013-06-10 MED ORDER — PANTOPRAZOLE SODIUM 40 MG PO TBEC
40.0000 mg | DELAYED_RELEASE_TABLET | Freq: Every day | ORAL | Status: DC
  Administered 2013-06-11: 40 mg via ORAL
  Filled 2013-06-10: qty 1

## 2013-06-10 MED ORDER — ONDANSETRON HCL 4 MG/2ML IJ SOLN
4.0000 mg | Freq: Once | INTRAMUSCULAR | Status: AC
  Administered 2013-06-10: 4 mg via INTRAVENOUS
  Filled 2013-06-10: qty 2

## 2013-06-10 MED ORDER — PANTOPRAZOLE SODIUM 40 MG IV SOLR
40.0000 mg | Freq: Every day | INTRAVENOUS | Status: DC
  Administered 2013-06-10: 40 mg via INTRAVENOUS
  Filled 2013-06-10 (×2): qty 40

## 2013-06-10 MED ORDER — HYDROMORPHONE HCL PF 1 MG/ML IJ SOLN
1.0000 mg | Freq: Once | INTRAMUSCULAR | Status: AC
  Administered 2013-06-10: 1 mg via INTRAVENOUS
  Filled 2013-06-10: qty 1

## 2013-06-10 MED ORDER — KCL IN DEXTROSE-NACL 20-5-0.45 MEQ/L-%-% IV SOLN
INTRAVENOUS | Status: DC
  Administered 2013-06-10 – 2013-06-11 (×2): via INTRAVENOUS
  Filled 2013-06-10 (×5): qty 1000

## 2013-06-10 MED ORDER — ONDANSETRON HCL 4 MG/2ML IJ SOLN
4.0000 mg | Freq: Four times a day (QID) | INTRAMUSCULAR | Status: DC | PRN
  Administered 2013-06-11: 4 mg via INTRAVENOUS
  Filled 2013-06-10: qty 2

## 2013-06-10 MED ORDER — ONDANSETRON HCL 4 MG PO TABS
4.0000 mg | ORAL_TABLET | Freq: Four times a day (QID) | ORAL | Status: DC | PRN
  Administered 2013-06-12: 4 mg via ORAL
  Filled 2013-06-10: qty 1

## 2013-06-10 MED ORDER — HYDROCODONE-ACETAMINOPHEN 5-325 MG PO TABS
1.0000 | ORAL_TABLET | ORAL | Status: DC | PRN
  Administered 2013-06-10 – 2013-06-11 (×5): 1 via ORAL
  Filled 2013-06-10 (×5): qty 1

## 2013-06-10 MED ORDER — MORPHINE SULFATE 2 MG/ML IJ SOLN
1.0000 mg | INTRAMUSCULAR | Status: DC | PRN
  Administered 2013-06-10 – 2013-06-11 (×5): 1 mg via INTRAVENOUS

## 2013-06-10 MED ORDER — HEPARIN SODIUM (PORCINE) 5000 UNIT/ML IJ SOLN
5000.0000 [IU] | Freq: Three times a day (TID) | INTRAMUSCULAR | Status: DC
  Administered 2013-06-10 – 2013-06-11 (×3): 5000 [IU] via SUBCUTANEOUS
  Filled 2013-06-10 (×6): qty 1

## 2013-06-10 MED ORDER — MORPHINE SULFATE 2 MG/ML IJ SOLN
1.0000 mg | INTRAMUSCULAR | Status: DC | PRN
  Filled 2013-06-10 (×6): qty 1

## 2013-06-10 NOTE — Progress Notes (Signed)
Pt arrived to unit via [facility] at 0630. Pt transferred to bed with ease. Pt is very drowsy but easily aroused. Doses off during conversation. Though she rates her pain a "10" out of 10, and falls asleep as soon as she rated her pain. Skin intact, abrasion noted to chin. Knee immobilizer to R leg, skin intact to R knee/leg, slight swelling. A&Ox3. Pt resting comfortably in bed, currently snoring. Oriented pt to room, call bell within reach.
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
## 2013-06-10 NOTE — ED Notes (Signed)
Pt has been immobilized in warm blankets for transfer to CT.
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
## 2013-06-10 NOTE — ED Notes (Signed)
Pt wasn't able to void at this time while using the bedpan
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
## 2013-06-10 NOTE — ED Notes (Signed)
Pt unable to void at this time. 

## 2013-06-11 DIAGNOSIS — S060XAA Concussion with loss of consciousness status unknown, initial encounter: Secondary | ICD-10-CM | POA: Diagnosis present

## 2013-06-11 DIAGNOSIS — D649 Anemia, unspecified: Secondary | ICD-10-CM | POA: Diagnosis present

## 2013-06-11 DIAGNOSIS — S82141A Displaced bicondylar fracture of right tibia, initial encounter for closed fracture: Secondary | ICD-10-CM | POA: Diagnosis present

## 2013-06-11 DIAGNOSIS — S060X9A Concussion with loss of consciousness of unspecified duration, initial encounter: Secondary | ICD-10-CM | POA: Diagnosis present

## 2013-06-11 MED ORDER — LORAZEPAM 0.5 MG PO TABS
0.5000 mg | ORAL_TABLET | ORAL | Status: DC | PRN
  Administered 2013-06-11 – 2013-06-12 (×2): 0.5 mg via ORAL
  Filled 2013-06-11 (×2): qty 1

## 2013-06-11 MED ORDER — TRAMADOL HCL 50 MG PO TABS
100.0000 mg | ORAL_TABLET | Freq: Four times a day (QID) | ORAL | Status: DC
  Administered 2013-06-11 – 2013-06-12 (×5): 100 mg via ORAL
  Filled 2013-06-11 (×5): qty 2

## 2013-06-11 MED ORDER — DOCUSATE SODIUM 100 MG PO CAPS
100.0000 mg | ORAL_CAPSULE | Freq: Two times a day (BID) | ORAL | Status: DC
  Administered 2013-06-11 – 2013-06-12 (×2): 100 mg via ORAL
  Filled 2013-06-11 (×3): qty 1

## 2013-06-11 MED ORDER — ENOXAPARIN SODIUM 40 MG/0.4ML ~~LOC~~ SOLN
40.0000 mg | SUBCUTANEOUS | Status: DC
  Administered 2013-06-11: 40 mg via SUBCUTANEOUS
  Filled 2013-06-11 (×2): qty 0.4

## 2013-06-11 MED ORDER — MORPHINE SULFATE 2 MG/ML IJ SOLN
2.0000 mg | INTRAMUSCULAR | Status: DC | PRN
  Administered 2013-06-11 – 2013-06-12 (×2): 2 mg via INTRAVENOUS
  Filled 2013-06-11 (×2): qty 1

## 2013-06-11 MED ORDER — POLYETHYLENE GLYCOL 3350 17 G PO PACK
17.0000 g | PACK | Freq: Every day | ORAL | Status: DC
  Administered 2013-06-12: 17 g via ORAL
  Filled 2013-06-11 (×2): qty 1

## 2013-06-11 MED ORDER — HYDROCODONE-ACETAMINOPHEN 10-325 MG PO TABS
0.5000 | ORAL_TABLET | ORAL | Status: DC | PRN
  Administered 2013-06-11: 2 via ORAL
  Administered 2013-06-11: 1 via ORAL
  Administered 2013-06-12 (×2): 2 via ORAL
  Filled 2013-06-11 (×4): qty 2

## 2013-06-11 NOTE — Clinical Social Work Note (Signed)
Clinical Social Worker continuing to follow patient and family for support.  CSW attempted to visit with patient again at bedside today, however patient was sleeping and patient mother requested that she not be woken up.  CSW to follow up with patient tomorrow to complete SBIRT.  CSW remains available for support as needed.  Macario GoldsJesse Alexsys Eskin, KentuckyLCSW [phone]

## 2013-06-11 NOTE — Progress Notes (Signed)
OT Cancellation Note  Patient Details Name: [name] MRN: [id number] DOB: 1980-11-07   Cancelled Treatment:    Reason Eval/Treat Not Completed:  Attempted x 2, pt too lethargic to participate with first, eating lunch with second attempt.  Will continue to follow.  Evern BioMayberry, Pradyun Ishman Lynn 06/11/2013, 1:39 PM
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
## 2013-06-11 NOTE — Discharge Instructions (Addendum)
Do not bear any weight on the right leg.  Wear the knee immobilizer at all times except showering.  No driving while taking oxycodone.

## 2013-06-11 NOTE — Progress Notes (Signed)
An anxiolytic may be helpful with this patient.  Her injries ar not so bad to require such significant amounts of meds.  This patient has been seen and I agree with the findings and treatment plan.  Marta LamasJames O. Gae BonWyatt, III, MD, FACS [phone] (pager) [phone] (direct pager) Trauma Surgeon
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
## 2013-06-11 NOTE — Progress Notes (Signed)
UR completed 

## 2013-06-12 DIAGNOSIS — D62 Acute posthemorrhagic anemia: Secondary | ICD-10-CM

## 2013-06-12 DIAGNOSIS — S0081XA Abrasion of other part of head, initial encounter: Secondary | ICD-10-CM | POA: Diagnosis present

## 2013-06-12 MED ORDER — BISACODYL 10 MG RE SUPP
10.0000 mg | Freq: Every day | RECTAL | Status: DC | PRN
  Filled 2013-06-12: qty 1

## 2013-06-12 MED ORDER — BACITRACIN ZINC 500 UNIT/GM EX OINT
TOPICAL_OINTMENT | Freq: Two times a day (BID) | CUTANEOUS | Status: DC
  Filled 2013-06-12 (×2): qty 28.35

## 2013-06-12 MED ORDER — MAGNESIUM HYDROXIDE 400 MG/5ML PO SUSP
30.0000 mL | Freq: Once | ORAL | Status: AC
  Administered 2013-06-12: 30 mL via ORAL
  Filled 2013-06-12: qty 30

## 2013-06-12 MED ORDER — TRAMADOL HCL 50 MG PO TABS: 100.0000 mg | ORAL_TABLET | Freq: Four times a day (QID) | ORAL | Status: DC

## 2013-06-12 MED ORDER — OXYCODONE-ACETAMINOPHEN 10-325 MG PO TABS: 1.0000 | ORAL_TABLET | ORAL | Status: DC | PRN

## 2013-06-12 MED ORDER — OXYCODONE HCL 5 MG PO TABS
10.0000 mg | ORAL_TABLET | ORAL | Status: DC | PRN
  Administered 2013-06-12: 20 mg via ORAL
  Administered 2013-06-12: 15 mg via ORAL
  Filled 2013-06-12: qty 4
  Filled 2013-06-12: qty 3
  Filled 2013-06-12: qty 4

## 2013-06-12 NOTE — Progress Notes (Addendum)
SW contacted - to see patient for D/C needs today. Anxious re: D/C - giving Ativan.
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
## 2013-06-12 NOTE — Progress Notes (Signed)
Patient looks a lot better today.  [facility]  Follow up will be with Dr. Carola FrostHandy  This patient has been seen and I agree with the findings and treatment plan.  Marta LamasJames O. Gae BonWyatt, III, MD, FACS [phone] (pager) [phone] (direct pager) Trauma Surgeon
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
## 2013-06-15 ENCOUNTER — Encounter (HOSPITAL_COMMUNITY): Payer: Self-pay | Admitting: *Deleted
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
## 2013-06-16 ENCOUNTER — Inpatient Hospital Stay (HOSPITAL_COMMUNITY)
Admission: RE | Admit: 2013-06-16 | Discharge: 2013-06-18 | DRG: 493 | Disposition: A | Discharge status: Home / Self Care | Payer: Medicaid Other | Source: Ambulatory Visit | Attending: Orthopedic Surgery | Admitting: Orthopedic Surgery

## 2013-06-16 ENCOUNTER — Encounter (HOSPITAL_COMMUNITY)
Admission: RE | Disposition: A | Discharge status: Home / Self Care | Payer: Self-pay | Source: Ambulatory Visit | Attending: Orthopedic Surgery

## 2013-06-16 ENCOUNTER — Encounter (HOSPITAL_COMMUNITY): Payer: Medicaid Other | Admitting: Certified Registered"

## 2013-06-16 ENCOUNTER — Ambulatory Visit (HOSPITAL_COMMUNITY): Payer: Medicaid Other

## 2013-06-16 ENCOUNTER — Ambulatory Visit (HOSPITAL_COMMUNITY): Payer: Medicaid Other | Admitting: Certified Registered"

## 2013-06-16 ENCOUNTER — Inpatient Hospital Stay (HOSPITAL_COMMUNITY): Payer: Medicaid Other

## 2013-06-16 ENCOUNTER — Encounter (HOSPITAL_COMMUNITY): Payer: Self-pay | Admitting: *Deleted

## 2013-06-16 DIAGNOSIS — F419 Anxiety disorder, unspecified: Secondary | ICD-10-CM | POA: Diagnosis present

## 2013-06-16 DIAGNOSIS — K219 Gastro-esophageal reflux disease without esophagitis: Secondary | ICD-10-CM | POA: Diagnosis present

## 2013-06-16 DIAGNOSIS — S82109A Unspecified fracture of upper end of unspecified tibia, initial encounter for closed fracture: Principal | ICD-10-CM | POA: Diagnosis present

## 2013-06-16 DIAGNOSIS — D62 Acute posthemorrhagic anemia: Secondary | ICD-10-CM | POA: Diagnosis not present

## 2013-06-16 DIAGNOSIS — G43909 Migraine, unspecified, not intractable, without status migrainosus: Secondary | ICD-10-CM | POA: Diagnosis present

## 2013-06-16 DIAGNOSIS — S82143A Displaced bicondylar fracture of unspecified tibia, initial encounter for closed fracture: Secondary | ICD-10-CM | POA: Diagnosis present

## 2013-06-16 DIAGNOSIS — F411 Generalized anxiety disorder: Secondary | ICD-10-CM | POA: Diagnosis present

## 2013-06-16 HISTORY — DX: Migraine, unspecified, not intractable, without status migrainosus: G43.909

## 2013-06-16 HISTORY — DX: Gastro-esophageal reflux disease without esophagitis: K21.9

## 2013-06-16 HISTORY — DX: Anxiety disorder, unspecified: F41.9

## 2013-06-16 HISTORY — PX: ORIF TIBIA PLATEAU: SHX2132

## 2013-06-16 LAB — PROTIME-INR
INR: 0.9 (ref 0.00–1.49)
Prothrombin Time: 12 seconds (ref 11.6–15.2)

## 2013-06-16 LAB — COMPREHENSIVE METABOLIC PANEL
ALK PHOS: 193 U/L — AB (ref 39–117)
ALT: 38 U/L — ABNORMAL HIGH (ref 0–35)
AST: 39 U/L — ABNORMAL HIGH (ref 0–37)
Albumin: 3.9 g/dL (ref 3.5–5.2)
BUN: 14 mg/dL (ref 6–23)
CALCIUM: 10.1 mg/dL (ref 8.4–10.5)
CO2: 23 mEq/L (ref 19–32)
CREATININE: 0.44 mg/dL — AB (ref 0.50–1.10)
Chloride: 96 mEq/L (ref 96–112)
GFR calc non Af Amer: >90 mL/min (ref >90)
GLUCOSE: 95 mg/dL (ref 70–99)
Potassium: 4.4 mEq/L (ref 3.7–5.3)
Sodium: 138 mEq/L (ref 137–147)
TOTAL PROTEIN: 8.5 g/dL — AB (ref 6.0–8.3)
Total Bilirubin: 0.6 mg/dL (ref 0.3–1.2)

## 2013-06-16 LAB — CBC WITH DIFFERENTIAL/PLATELET
Basophils Absolute: 0 10*3/uL (ref 0.0–0.1)
Basophils Relative: 0 % (ref 0–1)
EOS ABS: 0.3 10*3/uL (ref 0.0–0.7)
EOS PCT: 4 % (ref 0–5)
HCT: 38.5 % (ref 36.0–46.0)
HEMOGLOBIN: 12.4 g/dL (ref 12.0–15.0)
Lymphocytes Relative: 19 % (ref 12–46)
Lymphs Abs: 1.1 10*3/uL (ref 0.7–4.0)
MCH: 24 pg — AB (ref 26.0–34.0)
MCHC: 32.2 g/dL (ref 30.0–36.0)
MCV: 74.5 fL — AB (ref 78.0–100.0)
MONOS PCT: 9 % (ref 3–12)
Monocytes Absolute: 0.5 10*3/uL (ref 0.1–1.0)
Neutro Abs: 4 10*3/uL (ref 1.7–7.7)
Neutrophils Relative %: 68 % (ref 43–77)
Platelets: 286 10*3/uL (ref 150–400)
RBC: 5.17 MIL/uL — ABNORMAL HIGH (ref 3.87–5.11)
RDW: 14.1 % (ref 11.5–15.5)
WBC: 5.9 10*3/uL (ref 4.0–10.5)

## 2013-06-16 LAB — APTT: APTT: 34 s (ref 24–37)

## 2013-06-16 LAB — HCG, SERUM, QUALITATIVE: Preg, Serum: NEGATIVE

## 2013-06-16 SURGERY — OPEN REDUCTION INTERNAL FIXATION (ORIF) TIBIAL PLATEAU
Anesthesia: General | Site: Knee | Laterality: Right

## 2013-06-16 MED ORDER — ROCURONIUM BROMIDE 50 MG/5ML IV SOLN
INTRAVENOUS | Status: AC
  Filled 2013-06-16: qty 1

## 2013-06-16 MED ORDER — CEFAZOLIN SODIUM-DEXTROSE 2-3 GM-% IV SOLR: INTRAVENOUS | Status: DC | PRN

## 2013-06-16 MED ORDER — HYDROMORPHONE HCL PF 1 MG/ML IJ SOLN
INTRAMUSCULAR | Status: AC
  Filled 2013-06-16: qty 1

## 2013-06-16 MED ORDER — LIDOCAINE HCL (CARDIAC) 20 MG/ML IV SOLN
INTRAVENOUS | Status: DC | PRN
  Administered 2013-06-16: 80 mg via INTRAVENOUS
  Administered 2013-06-16: 60 mg via INTRATRACHEAL

## 2013-06-16 MED ORDER — METHOCARBAMOL 100 MG/ML IJ SOLN
1000.0000 mg | Freq: Four times a day (QID) | INTRAVENOUS | Status: DC | PRN
  Administered 2013-06-16: 1000 mg via INTRAVENOUS
  Filled 2013-06-16: qty 10

## 2013-06-16 MED ORDER — OXYCODONE HCL 5 MG PO TABS
5.0000 mg | ORAL_TABLET | ORAL | Status: DC | PRN
  Administered 2013-06-16: 10 mg via ORAL
  Administered 2013-06-17: 5 mg via ORAL
  Administered 2013-06-18: 10 mg via ORAL
  Filled 2013-06-16: qty 2
  Filled 2013-06-16: qty 1

## 2013-06-16 MED ORDER — ONDANSETRON HCL 4 MG/2ML IJ SOLN: 4.0000 mg | Freq: Once | INTRAMUSCULAR | Status: DC | PRN

## 2013-06-16 MED ORDER — METOCLOPRAMIDE HCL 5 MG/ML IJ SOLN: 5.0000 mg | Freq: Three times a day (TID) | INTRAMUSCULAR | Status: DC | PRN

## 2013-06-16 MED ORDER — LACTATED RINGERS IV SOLN
INTRAVENOUS | Status: DC
  Administered 2013-06-16 (×2): via INTRAVENOUS

## 2013-06-16 MED ORDER — CEFAZOLIN SODIUM-DEXTROSE 2-3 GM-% IV SOLR
2.0000 g | INTRAVENOUS | Status: AC
  Administered 2013-06-16: 2 g via INTRAVENOUS
  Filled 2013-06-16: qty 50

## 2013-06-16 MED ORDER — METHOCARBAMOL 500 MG PO TABS
1000.0000 mg | ORAL_TABLET | Freq: Four times a day (QID) | ORAL | Status: DC | PRN
  Administered 2013-06-17 – 2013-06-18 (×4): 1000 mg via ORAL
  Filled 2013-06-16 (×7): qty 2

## 2013-06-16 MED ORDER — POLYETHYLENE GLYCOL 3350 17 G PO PACK
17.0000 g | PACK | Freq: Every day | ORAL | Status: DC
  Administered 2013-06-17 – 2013-06-18 (×2): 17 g via ORAL
  Filled 2013-06-16 (×4): qty 1

## 2013-06-16 MED ORDER — ACETAMINOPHEN 500 MG PO TABS
1000.0000 mg | ORAL_TABLET | Freq: Once | ORAL | Status: AC
Start: 2013-06-16 — End: 2013-06-16
  Administered 2013-06-16: 1000 mg via ORAL
  Filled 2013-06-16: qty 2

## 2013-06-16 MED ORDER — LIDOCAINE HCL (CARDIAC) 20 MG/ML IV SOLN
INTRAVENOUS | Status: AC
  Filled 2013-06-16: qty 5

## 2013-06-16 MED ORDER — HYDROMORPHONE HCL PF 1 MG/ML IJ SOLN
0.5000 mg | INTRAMUSCULAR | Status: DC | PRN
  Administered 2013-06-16 – 2013-06-17 (×4): 1 mg via INTRAVENOUS
  Filled 2013-06-16 (×4): qty 1

## 2013-06-16 MED ORDER — GLYCOPYRROLATE 0.2 MG/ML IJ SOLN
INTRAMUSCULAR | Status: DC | PRN
  Administered 2013-06-16: 0.4 mg via INTRAVENOUS

## 2013-06-16 MED ORDER — FENTANYL CITRATE 0.05 MG/ML IJ SOLN
INTRAMUSCULAR | Status: DC | PRN
  Administered 2013-06-16 (×2): 50 ug via INTRAVENOUS
  Administered 2013-06-16: 150 ug via INTRAVENOUS
  Administered 2013-06-16 (×2): 50 ug via INTRAVENOUS

## 2013-06-16 MED ORDER — ONDANSETRON HCL 4 MG/2ML IJ SOLN
INTRAMUSCULAR | Status: AC
  Filled 2013-06-16: qty 2

## 2013-06-16 MED ORDER — FENTANYL CITRATE 0.05 MG/ML IJ SOLN
INTRAMUSCULAR | Status: AC
  Filled 2013-06-16: qty 2

## 2013-06-16 MED ORDER — METOCLOPRAMIDE HCL 10 MG PO TABS: 5.0000 mg | ORAL_TABLET | Freq: Three times a day (TID) | ORAL | Status: DC | PRN

## 2013-06-16 MED ORDER — CEFAZOLIN SODIUM 1-5 GM-% IV SOLN
1.0000 g | Freq: Four times a day (QID) | INTRAVENOUS | Status: DC
  Administered 2013-06-16: 1 g via INTRAVENOUS
  Filled 2013-06-16 (×2): qty 50

## 2013-06-16 MED ORDER — OXYCODONE-ACETAMINOPHEN 5-325 MG PO TABS
ORAL_TABLET | ORAL | Status: AC
  Filled 2013-06-16: qty 2

## 2013-06-16 MED ORDER — OXYCODONE HCL 5 MG PO TABS
ORAL_TABLET | ORAL | Status: AC
  Filled 2013-06-16: qty 2

## 2013-06-16 MED ORDER — OXYCODONE HCL 5 MG PO TABS
5.0000 mg | ORAL_TABLET | Freq: Four times a day (QID) | ORAL | Status: DC | PRN
  Administered 2013-06-16 – 2013-06-17 (×2): 10 mg via ORAL
  Administered 2013-06-17 (×3): 5 mg via ORAL
  Administered 2013-06-18 (×2): 10 mg via ORAL
  Filled 2013-06-16: qty 2
  Filled 2013-06-16: qty 1
  Filled 2013-06-16 (×2): qty 2
  Filled 2013-06-16 (×2): qty 1
  Filled 2013-06-16: qty 2

## 2013-06-16 MED ORDER — FENTANYL CITRATE 0.05 MG/ML IJ SOLN
INTRAMUSCULAR | Status: AC
  Filled 2013-06-16: qty 5

## 2013-06-16 MED ORDER — ONDANSETRON HCL 4 MG/2ML IJ SOLN: 4.0000 mg | Freq: Four times a day (QID) | INTRAMUSCULAR | Status: DC | PRN

## 2013-06-16 MED ORDER — ROCURONIUM BROMIDE 100 MG/10ML IV SOLN
INTRAVENOUS | Status: DC | PRN
  Administered 2013-06-16: 40 mg via INTRAVENOUS
  Administered 2013-06-16 (×2): 10 mg via INTRAVENOUS

## 2013-06-16 MED ORDER — GLYCOPYRROLATE 0.2 MG/ML IJ SOLN
INTRAMUSCULAR | Status: AC
  Filled 2013-06-16: qty 2

## 2013-06-16 MED ORDER — NEOSTIGMINE METHYLSULFATE 1 MG/ML IJ SOLN
INTRAMUSCULAR | Status: DC | PRN
  Administered 2013-06-16: 3 mg via INTRAVENOUS

## 2013-06-16 MED ORDER — ARTIFICIAL TEARS OP OINT
TOPICAL_OINTMENT | OPHTHALMIC | Status: DC | PRN
  Administered 2013-06-16: 1 via OPHTHALMIC

## 2013-06-16 MED ORDER — HYDROMORPHONE HCL PF 1 MG/ML IJ SOLN
0.2500 mg | INTRAMUSCULAR | Status: DC | PRN
  Administered 2013-06-16: 0.25 mg via INTRAVENOUS
  Administered 2013-06-16 (×2): 0.5 mg via INTRAVENOUS
  Administered 2013-06-16: 0.25 mg via INTRAVENOUS
  Administered 2013-06-16: 0.5 mg via INTRAVENOUS

## 2013-06-16 MED ORDER — SODIUM CHLORIDE 0.9 % IJ SOLN
INTRAMUSCULAR | Status: AC
  Filled 2013-06-16: qty 10

## 2013-06-16 MED ORDER — PROPOFOL 10 MG/ML IV BOLUS
INTRAVENOUS | Status: AC
  Filled 2013-06-16: qty 20

## 2013-06-16 MED ORDER — PHENYLEPHRINE HCL 10 MG/ML IJ SOLN
INTRAMUSCULAR | Status: DC | PRN
  Administered 2013-06-16 (×2): 80 ug via INTRAVENOUS

## 2013-06-16 MED ORDER — HYDROMORPHONE HCL PF 1 MG/ML IJ SOLN
0.2500 mg | INTRAMUSCULAR | Status: DC | PRN
  Administered 2013-06-16 (×4): 0.5 mg via INTRAVENOUS

## 2013-06-16 MED ORDER — NEOSTIGMINE METHYLSULFATE 1 MG/ML IJ SOLN
INTRAMUSCULAR | Status: AC
  Filled 2013-06-16: qty 10

## 2013-06-16 MED ORDER — POTASSIUM CHLORIDE IN NACL 20-0.9 MEQ/L-% IV SOLN
INTRAVENOUS | Status: DC
  Administered 2013-06-17: 06:00:00 via INTRAVENOUS
  Filled 2013-06-16 (×3): qty 1000

## 2013-06-16 MED ORDER — OXYCODONE-ACETAMINOPHEN 5-325 MG PO TABS
1.0000 | ORAL_TABLET | Freq: Four times a day (QID) | ORAL | Status: DC | PRN
  Administered 2013-06-16 – 2013-06-17 (×3): 2 via ORAL
  Administered 2013-06-17: 1 via ORAL
  Administered 2013-06-17 – 2013-06-18 (×3): 2 via ORAL
  Filled 2013-06-16 (×4): qty 2
  Filled 2013-06-16: qty 1
  Filled 2013-06-16: qty 2

## 2013-06-16 MED ORDER — MIDAZOLAM HCL 2 MG/2ML IJ SOLN
INTRAMUSCULAR | Status: AC
  Filled 2013-06-16: qty 2

## 2013-06-16 MED ORDER — MIDAZOLAM HCL 5 MG/5ML IJ SOLN
INTRAMUSCULAR | Status: DC | PRN
  Administered 2013-06-16: 2 mg via INTRAVENOUS

## 2013-06-16 MED ORDER — ARTIFICIAL TEARS OP OINT
TOPICAL_OINTMENT | OPHTHALMIC | Status: AC
  Filled 2013-06-16: qty 3.5

## 2013-06-16 MED ORDER — ENOXAPARIN SODIUM 40 MG/0.4ML ~~LOC~~ SOLN
40.0000 mg | SUBCUTANEOUS | Status: DC
  Administered 2013-06-17 – 2013-06-18 (×2): 40 mg via SUBCUTANEOUS
  Filled 2013-06-16 (×3): qty 0.4

## 2013-06-16 MED ORDER — CHLORHEXIDINE GLUCONATE 4 % EX LIQD: 60.0000 mL | Freq: Once | CUTANEOUS | Status: DC

## 2013-06-16 MED ORDER — DOCUSATE SODIUM 100 MG PO CAPS
100.0000 mg | ORAL_CAPSULE | Freq: Two times a day (BID) | ORAL | Status: DC
  Administered 2013-06-16 – 2013-06-18 (×4): 100 mg via ORAL
  Filled 2013-06-16 (×5): qty 1

## 2013-06-16 MED ORDER — ONDANSETRON HCL 4 MG PO TABS: 4.0000 mg | ORAL_TABLET | Freq: Four times a day (QID) | ORAL | Status: DC | PRN

## 2013-06-16 MED ORDER — BISACODYL 10 MG RE SUPP: 10.0000 mg | Freq: Every day | RECTAL | Status: DC | PRN

## 2013-06-16 MED ORDER — 0.9 % SODIUM CHLORIDE (POUR BTL) OPTIME
TOPICAL | Status: DC | PRN
  Administered 2013-06-16: 1000 mL

## 2013-06-16 MED ORDER — EPHEDRINE SULFATE 50 MG/ML IJ SOLN
INTRAMUSCULAR | Status: AC
  Filled 2013-06-16: qty 1

## 2013-06-16 MED ORDER — PROPOFOL 10 MG/ML IV BOLUS
INTRAVENOUS | Status: DC | PRN
  Administered 2013-06-16: 200 mg via INTRAVENOUS

## 2013-06-16 SURGICAL SUPPLY — 93 items
APPLIER CLIP 11 MED OPEN (CLIP) ×3
BANDAGE ELASTIC 4 VELCRO ST LF (GAUZE/BANDAGES/DRESSINGS) ×3 IMPLANT
BANDAGE ELASTIC 6 VELCRO ST LF (GAUZE/BANDAGES/DRESSINGS) ×3 IMPLANT
BANDAGE ESMARK 6X9 LF (GAUZE/BANDAGES/DRESSINGS) ×1 IMPLANT
BANDAGE GAUZE ELAST BULKY 4 IN (GAUZE/BANDAGES/DRESSINGS) ×3 IMPLANT
BIT DRILL 2.5X110 QC LCP DISP (BIT) ×3 IMPLANT
BIT DRILL PERC QC 2.8X200 100 (BIT) ×1 IMPLANT
BIT DRILL QC 3.5X110 (BIT) ×3 IMPLANT
BLADE SURG 10 STRL SS (BLADE) ×3 IMPLANT
BLADE SURG 15 STRL LF DISP TIS (BLADE) ×1 IMPLANT
BLADE SURG 15 STRL SS (BLADE) ×2
BLADE SURG ROTATE 9660 (MISCELLANEOUS) IMPLANT
BNDG COHESIVE 4X5 TAN STRL (GAUZE/BANDAGES/DRESSINGS) ×3 IMPLANT
BNDG ESMARK 6X9 LF (GAUZE/BANDAGES/DRESSINGS) ×3
BNDG GAUZE ELAST 4 BULKY (GAUZE/BANDAGES/DRESSINGS) ×3 IMPLANT
BRUSH SCRUB DISP (MISCELLANEOUS) ×6 IMPLANT
CANISTER SUCT 3000ML (MISCELLANEOUS) ×3 IMPLANT
CLIP APPLIE 11 MED OPEN (CLIP) ×1 IMPLANT
COMPONENT TIB PRX PSTMD 3.5 2H (Orthopedic Implant) ×1 IMPLANT
COVER MAYO STAND STRL (DRAPES) ×3 IMPLANT
COVER SURGICAL LIGHT HANDLE (MISCELLANEOUS) ×3 IMPLANT
DRAPE C-ARM 42X72 X-RAY (DRAPES) ×3 IMPLANT
DRAPE C-ARMOR (DRAPES) ×3 IMPLANT
DRAPE INCISE IOBAN 66X45 STRL (DRAPES) ×3 IMPLANT
DRAPE ORTHO SPLIT 77X108 STRL (DRAPES)
DRAPE SURG ORHT 6 SPLT 77X108 (DRAPES) IMPLANT
DRAPE U-SHAPE 47X51 STRL (DRAPES) ×3 IMPLANT
DRILL BIT QUICK COUP 2.8MM 100 (BIT) ×2
DRSG ADAPTIC 3X8 NADH LF (GAUZE/BANDAGES/DRESSINGS) ×3 IMPLANT
DRSG PAD ABDOMINAL 8X10 ST (GAUZE/BANDAGES/DRESSINGS) ×12 IMPLANT
ELECT REM PT RETURN 9FT ADLT (ELECTROSURGICAL) ×3
ELECTRODE REM PT RTRN 9FT ADLT (ELECTROSURGICAL) ×1 IMPLANT
EVACUATOR 1/8 PVC DRAIN (DRAIN) IMPLANT
EVACUATOR 3/16  PVC DRAIN (DRAIN)
EVACUATOR 3/16 PVC DRAIN (DRAIN) IMPLANT
GLOVE BIO SURGEON STRL SZ7.5 (GLOVE) ×3 IMPLANT
GLOVE BIO SURGEON STRL SZ8 (GLOVE) ×3 IMPLANT
GLOVE BIOGEL PI IND STRL 7.5 (GLOVE) ×1 IMPLANT
GLOVE BIOGEL PI IND STRL 8 (GLOVE) ×1 IMPLANT
GLOVE BIOGEL PI INDICATOR 7.5 (GLOVE) ×2
GLOVE BIOGEL PI INDICATOR 8 (GLOVE) ×2
GOWN STRL REUS W/ TWL LRG LVL3 (GOWN DISPOSABLE) ×2 IMPLANT
GOWN STRL REUS W/ TWL XL LVL3 (GOWN DISPOSABLE) ×1 IMPLANT
GOWN STRL REUS W/TWL LRG LVL3 (GOWN DISPOSABLE) ×4
GOWN STRL REUS W/TWL XL LVL3 (GOWN DISPOSABLE) ×2
IMMOBILIZER KNEE 22 UNIV (SOFTGOODS) ×3 IMPLANT
K-WIRE 1.6X150 (WIRE) ×6
KIT BASIN OR (CUSTOM PROCEDURE TRAY) ×3 IMPLANT
KIT ROOM TURNOVER OR (KITS) ×3 IMPLANT
KWIRE 1.6X150 (WIRE) ×2 IMPLANT
NDL SUT 6 .5 CRC .975X.05 MAYO (NEEDLE) IMPLANT
NEEDLE 22X1 1/2 (OR ONLY) (NEEDLE) IMPLANT
NEEDLE MAYO TAPER (NEEDLE)
NS IRRIG 1000ML POUR BTL (IV SOLUTION) ×3 IMPLANT
PACK ORTHO EXTREMITY (CUSTOM PROCEDURE TRAY) ×3 IMPLANT
PAD ARMBOARD 7.5X6 YLW CONV (MISCELLANEOUS) ×6 IMPLANT
PAD CAST 4YDX4 CTTN HI CHSV (CAST SUPPLIES) ×1 IMPLANT
PADDING CAST COTTON 4X4 STRL (CAST SUPPLIES) ×2
PADDING CAST COTTON 6X4 STRL (CAST SUPPLIES) ×3 IMPLANT
SCREW CORT 3.5X44M SELF TAP (Screw) ×3 IMPLANT
SCREW CORT 3.5X48M SELF TAP (Screw) ×3 IMPLANT
SCREW CORTEX 3.5 34MM (Screw) ×2 IMPLANT
SCREW CORTEX 3.5 36MM (Screw) ×2 IMPLANT
SCREW CORTEX 3.5 45MM (Screw) ×3 IMPLANT
SCREW LOCK CORT ST 3.5X34 (Screw) ×1 IMPLANT
SCREW LOCK CORT ST 3.5X36 (Screw) ×1 IMPLANT
SCREW LOCK T15 FT 60X3.5X2.9X (Screw) ×1 IMPLANT
SCREW LOCKING 3.5X50 (Screw) ×3 IMPLANT
SCREW LOCKING 3.5X60 (Screw) ×2 IMPLANT
SCREW SELF TAP 65MM (Screw) ×3 IMPLANT
SPONGE GAUZE 4X4 12PLY (GAUZE/BANDAGES/DRESSINGS) ×3 IMPLANT
SPONGE LAP 18X18 X RAY DECT (DISPOSABLE) ×3 IMPLANT
STAPLER VISISTAT 35W (STAPLE) ×3 IMPLANT
STOCKINETTE IMPERVIOUS LG (DRAPES) ×3 IMPLANT
SUCTION FRAZIER TIP 10 FR DISP (SUCTIONS) ×3 IMPLANT
SUT ETHILON 3 0 PS 1 (SUTURE) IMPLANT
SUT PROLENE 0 CT 2 (SUTURE) ×6 IMPLANT
SUT PROLENE 6 0 C 1 30 (SUTURE) ×3 IMPLANT
SUT VIC AB 0 CT1 27 (SUTURE) ×2
SUT VIC AB 0 CT1 27XBRD ANBCTR (SUTURE) ×1 IMPLANT
SUT VIC AB 1 CT1 27 (SUTURE) ×2
SUT VIC AB 1 CT1 27XBRD ANBCTR (SUTURE) ×1 IMPLANT
SUT VIC AB 2-0 CT1 27 (SUTURE) ×4
SUT VIC AB 2-0 CT1 TAPERPNT 27 (SUTURE) ×2 IMPLANT
SYR 20ML ECCENTRIC (SYRINGE) IMPLANT
TIBIA PROX POSTMED LCP 3.5 2H (Orthopedic Implant) ×3 IMPLANT
TOWEL OR 17X24 6PK STRL BLUE (TOWEL DISPOSABLE) ×3 IMPLANT
TOWEL OR 17X26 10 PK STRL BLUE (TOWEL DISPOSABLE) ×6 IMPLANT
TRAY FOLEY CATH 16FRSI W/METER (SET/KITS/TRAYS/PACK) IMPLANT
TUBE CONNECTING 12'X1/4 (SUCTIONS) ×1
TUBE CONNECTING 12X1/4 (SUCTIONS) ×2 IMPLANT
WATER STERILE IRR 1000ML POUR (IV SOLUTION) ×6 IMPLANT
YANKAUER SUCT BULB TIP NO VENT (SUCTIONS) ×3 IMPLANT

## 2013-06-16 NOTE — Brief Op Note (Signed)
06/16/2013  2:00 PM  PATIENT:  [name] CapuchinStephanie [name]  33 y.o. female  PRE-OPERATIVE DIAGNOSIS:  Right Tibia Plateau Fracture, medial  POST-OPERATIVE DIAGNOSIS:  right Tibial Plateau Fracture, medial   PROCEDURE:  Procedure(s): OPEN REDUCTION INTERNAL FIXATION (ORIF) RIGHT TIBIAL PLATEAU (Right) unicondylar  SURGEON:  Surgeon(s) and Role:    * Budd PalmerMichael H Denaisha Swango, MD - Primary  PHYSICIAN ASSISTANT: Montez MoritaKeith Paul, PA-C  ANESTHESIA:   general  I/O:  Total I/O In: 1800 [I.V.:1800] Out: 200 [Urine:100; Blood:100]  SPECIMEN:  No Specimen  TOURNIQUET:   Total Tourniquet Time Documented: Thigh (Right) - 56 minutes Total: Thigh (Right) - 56 minutes   DICTATION: .Other Dictation: Dictation Number [id number]

## 2013-06-16 NOTE — Progress Notes (Signed)
Orthopedic Tech Progress Note Patient Details:  [name] CapuchinStephanie [name] 07/28/1980 [id number] OHF applied to bed Patient ID: [name], female   DOB: 07/28/1980, 33 y.o.   MRN: [id number]   Orie Routsia R Thompson 06/16/2013, 2:32 PM
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
## 2013-06-16 NOTE — Transfer of Care (Signed)
Immediate Anesthesia Transfer of Care Note  Patient: [name] CapuchinStephanie [name]  Procedure(s) Performed: Procedure(s): OPEN REDUCTION INTERNAL FIXATION (ORIF) RIGHT TIBIAL PLATEAU (Right)  Patient Location: PACU  Anesthesia Type:General  Level of Consciousness: awake, alert , oriented and patient cooperative  Airway & Oxygen Therapy: Patient Spontanous Breathing and Patient connected to nasal cannula oxygen  Post-op Assessment: Report given to PACU RN, Post -op Vital signs reviewed and stable and Patient moving all extremities  Post vital signs: Reviewed and stable  Complications: No apparent anesthesia complications

## 2013-06-16 NOTE — Preoperative (Signed)
Beta Blockers   Reason not to administer Beta Blockers:Not Applicable 

## 2013-06-16 NOTE — Anesthesia Procedure Notes (Signed)
Procedure Name: Intubation Date/Time: 06/16/2013 11:30 AM Performed by: Jerilee HohMUMM, Martyna Thorns N Pre-anesthesia Checklist: Patient identified, Emergency Drugs available, Suction available and Patient being monitored Patient Re-evaluated:Patient Re-evaluated prior to inductionOxygen Delivery Method: Circle system utilized Preoxygenation: Pre-oxygenation with 100% oxygen Intubation Type: IV induction Ventilation: Mask ventilation without difficulty Laryngoscope Size: Mac and 3 Grade View: Grade II Tube type: Oral Tube size: 7.0 mm Number of attempts: 1 Airway Equipment and Method: Stylet Placement Confirmation: ETT inserted through vocal cords under direct vision,  positive ETCO2 and breath sounds checked- equal and bilateral Secured at: 21 cm Tube secured with: Tape Dental Injury: Teeth and Oropharynx as per pre-operative assessment  Comments: Patient presented with poor dentition as noted preop. Some bleeding noted on gums after intubation, but teeth and lips as preop.

## 2013-06-16 NOTE — H&P (Signed)
I saw and examined the patient with [name], communicating the findings and plan noted above.  I discussed with the patient the risks and benefits of surgery, including the possibility of infection, malunion, nonunion, nerve injury, vessel injury, wound breakdown, arthritis, symptomatic hardware, DVT/ PE, loss of motion, and need for further surgery among others.  She understood these risks and wished to proceed.  Myrene GalasMichael Bo Rogue, MD [facility] [phone] (p)

## 2013-06-16 NOTE — Anesthesia Preprocedure Evaluation (Addendum)
Anesthesia Evaluation  Patient identified by MRN, date of birth, ID band Patient awake    Reviewed: Allergy & Precautions, H&P , NPO status , Patient's Chart, lab work & pertinent test results, reviewed documented beta blocker date and time   Airway Mallampati: II TM Distance: >3 FB Neck ROM: Full    Dental  (+) Poor Dentition, Chipped, Dental Advisory Given   Pulmonary          Cardiovascular     Neuro/Psych  Headaches, Anxiety    GI/Hepatic GERD-  ,  Endo/Other    Renal/GU      Musculoskeletal   Abdominal   Peds  Hematology  (+) anemia ,   Anesthesia Other Findings   Reproductive/Obstetrics                           Anesthesia Physical Anesthesia Plan  ASA: I  Anesthesia Plan: General   Post-op Pain Management:    Induction: Intravenous  Airway Management Planned: Oral ETT  Additional Equipment:   Intra-op Plan:   Post-operative Plan: Extubation in OR  Informed Consent: I have reviewed the patients History and Physical, chart, labs and discussed the procedure including the risks, benefits and alternatives for the proposed anesthesia with the patient or authorized representative who has indicated his/her understanding and acceptance.   Dental advisory given  Plan Discussed with: Anesthesiologist and Surgeon  Anesthesia Plan Comments:         Anesthesia Quick Evaluation

## 2013-06-17 ENCOUNTER — Encounter (HOSPITAL_COMMUNITY): Payer: Self-pay | Admitting: Orthopedic Surgery

## 2013-06-17 LAB — BASIC METABOLIC PANEL
BUN: 6 mg/dL (ref 6–23)
CHLORIDE: 99 meq/L (ref 96–112)
CO2: 26 mEq/L (ref 19–32)
Calcium: 9.1 mg/dL (ref 8.4–10.5)
Creatinine, Ser: 0.57 mg/dL (ref 0.50–1.10)
GFR calc Af Amer: >90 mL/min (ref >90)
GFR calc non Af Amer: >90 mL/min (ref >90)
GLUCOSE: 128 mg/dL — AB (ref 70–99)
POTASSIUM: 3.7 meq/L (ref 3.7–5.3)
Sodium: 138 mEq/L (ref 137–147)

## 2013-06-17 LAB — CBC
HCT: 32 % — ABNORMAL LOW (ref 36.0–46.0)
HEMOGLOBIN: 10.4 g/dL — AB (ref 12.0–15.0)
MCH: 24 pg — ABNORMAL LOW (ref 26.0–34.0)
MCHC: 32.5 g/dL (ref 30.0–36.0)
MCV: 73.7 fL — ABNORMAL LOW (ref 78.0–100.0)
Platelets: 295 10*3/uL (ref 150–400)
RBC: 4.34 MIL/uL (ref 3.87–5.11)
RDW: 14.1 % (ref 11.5–15.5)
WBC: 5.9 10*3/uL (ref 4.0–10.5)

## 2013-06-17 MED ORDER — CEFAZOLIN SODIUM 1-5 GM-% IV SOLN
1.0000 g | Freq: Four times a day (QID) | INTRAVENOUS | Status: AC
  Administered 2013-06-17 (×2): 1 g via INTRAVENOUS
  Filled 2013-06-17 (×3): qty 50

## 2013-06-17 MED ORDER — HYDROXYZINE HCL 25 MG PO TABS
50.0000 mg | ORAL_TABLET | Freq: Three times a day (TID) | ORAL | Status: DC | PRN
  Administered 2013-06-18: 50 mg via ORAL
  Filled 2013-06-17: qty 2

## 2013-06-17 NOTE — Progress Notes (Signed)
Orthopedic Tech Progress Note Patient Details:  Francisco CapuchinStephanie Sheetz [birth year] [id number]  Patient ID: Francisco CapuchinStephanie Rodriguez, female   DOB: [birth year], 33 y.o.   MRN: [id number] Brace order completed by [facility]  Ninel Abdella 06/17/2013, 4:07 PM

## 2013-06-17 NOTE — Anesthesia Postprocedure Evaluation (Signed)
  Anesthesia Post-op Note  Patient: Francisco CapuchinStephanie Echevarria  Procedure(s) Performed: Procedure(s): OPEN REDUCTION INTERNAL FIXATION (ORIF) RIGHT TIBIAL PLATEAU (Right)  Patient Location: PACU  Anesthesia Type:General  Level of Consciousness: awake, alert , oriented and patient cooperative  Airway and Oxygen Therapy: Patient Spontanous Breathing  Post-op Pain: mild  Post-op Assessment: Post-op Vital signs reviewed, Patient's Cardiovascular Status Stable, Respiratory Function Stable, Patent Airway, No signs of Nausea or vomiting and Pain level controlled  Post-op Vital Signs: stable  Complications: No apparent anesthesia complications
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
## 2013-06-17 NOTE — Progress Notes (Signed)
Utilization review completed.  

## 2013-06-17 NOTE — Progress Notes (Signed)
OT Cancellation and Discharge Note  Patient Details Name: Francisco CapuchinStephanie Bicknell MRN: [id number] DOB: [birth year]   Cancelled Treatment:    Reason Eval/Treat Not Completed: Other (comment). Pt visited today for OT evaluation, however pt familiar with compensatory dressing techniques as she has recently been NWB on RLE. Pt will have assistance at home and has no concerns for ADL performance. Pt declines acute OT evaluation at this time. Acute OT to sign off.   Rae LipsMiller, Sarahelizabeth Conway M [phone] 06/17/2013, 2:59 PM

## 2013-06-17 NOTE — Progress Notes (Signed)
Orthopaedic Trauma Service Progress Note  Subjective  Pain limiting ability to mobilize Sleeping now though Has yet to be out of bed   Objective   BP 133/91  Pulse 101  Temp(Src) 98.1 F (36.7 C) (Oral)  Resp 18  Ht 5\' 1"  (1.549 m)  Wt 52.164 kg (115 lb)  BMI 21.74 kg/m2  SpO2 97%  LMP 06/14/2013  Intake/Output     03/24 0701 - 03/25 0700 03/25 0701 - 03/26 0700   I.V. (mL/kg) 1800 (34.5)    Total Intake(mL/kg) 1800 (34.5)    Urine (mL/kg/hr) 1850    Blood 100    Total Output 1950     Net -150            Labs  Results for [name], [name] (MRN [id number]) as of 06/17/2013 09:11  Ref. Range 06/17/2013 06:22  Sodium Latest Range: 137-147 mEq/L 138  Potassium Latest Range: 3.7-5.3 mEq/L 3.7  Chloride Latest Range: 96-112 mEq/L 99  CO2 Latest Range: 19-32 mEq/L 26  BUN Latest Range: 6-23 mg/dL 6  Creatinine Latest Range: 0.50-1.10 mg/dL 4.090.57  Calcium Latest Range: 8.4-10.5 mg/dL 9.1  GFR calc non Af Amer Latest Range: >90 mL/min >90  GFR calc Af Amer Latest Range: >90 mL/min >90  Glucose Latest Range: 70-99 mg/dL 811128 (H)  WBC Latest Range: 4.0-10.5 K/uL 5.9  RBC Latest Range: 3.87-5.11 MIL/uL 4.34  Hemoglobin Latest Range: 12.0-15.0 g/dL 91.410.4 (L)  HCT Latest Range: 36.0-46.0 % 32.0 (L)  MCV Latest Range: 78.0-100.0 fL 73.7 (L)  MCH Latest Range: 26.0-34.0 pg 24.0 (L)  MCHC Latest Range: 30.0-36.0 g/dL 78.232.5  RDW Latest Range: 11.5-15.5 % 14.1  Platelets Latest Range: 150-400 K/uL 295    Exam  Gen: sleeping but arousable, anxious with movement of leg  Ext:       Right Lower Extremity   Dressing c/d/i  Swelling stable  Distal motor and sensory functions intact  + DP pulse  Pain with motion of ankle and toes but pt tolerates better after initial movement   Assessment and Plan   POD/HD#: 1  1. R tibial plateau fracture:  NWB x 6 weeks  Unrestricted ROM R knee  Hinged knee brace ordered  Ice and elevation  No pillows under knee when at rest, place  under ankle  PT/OT evals  2. Pain management:  Encourage oral meds  Continue with current regimen  3. ABL anemia/Hemodynamics  Stable  4. DVT/PE prophylaxis:  Lovenox  Will dc home with 3 week course of lovenox as well  5. ID:   Completed periop abx  6. Activity:  Activity as tolerated  NWB R leg  Motion as tolerated R knee   7. FEN/Foley/Lines:  Dc foley  KVO IVF  8. Dispo:  PT/OT evals  Anticipate dc home tomorrow     Mearl LatinKeith W. Draven Natter, PA-C [facility] [phone] ([phone] 06/17/2013 8:39 AM

## 2013-06-17 NOTE — Progress Notes (Signed)
Orthopedic Tech Progress Note Patient Details:  Francisco CapuchinStephanie Westermeyer [birth year] [id number]  Patient ID: Francisco CapuchinStephanie Filley, female   DOB: [birth year], 33 y.o.   MRN: [id number] Called in [employer] brace order; spoke with Christin BachJulia  Vinnie Bobst 06/17/2013, 1:42 PM

## 2013-06-17 NOTE — Progress Notes (Signed)
Orthopedic Tech Progress Note Patient Details:  [name] CapuchinStephanie Bring 1980/07/03 [id number]  Patient ID: [name] CapuchinStephanie [name], female   DOB: 1980/07/03, 33 y.o.   MRN: [id number] Spoke with Annice PihJackie for [employer] brace order  Nikki DomCrawford, Dystany Duffy 06/17/2013, 4:28 PM

## 2013-06-18 ENCOUNTER — Encounter (HOSPITAL_COMMUNITY): Payer: Self-pay | Admitting: General Practice

## 2013-06-18 LAB — CBC
HEMATOCRIT: 33 % — AB (ref 36.0–46.0)
Hemoglobin: 10.7 g/dL — ABNORMAL LOW (ref 12.0–15.0)
MCH: 24 pg — AB (ref 26.0–34.0)
MCHC: 32.4 g/dL (ref 30.0–36.0)
MCV: 74 fL — ABNORMAL LOW (ref 78.0–100.0)
PLATELETS: 280 10*3/uL (ref 150–400)
RBC: 4.46 MIL/uL (ref 3.87–5.11)
RDW: 14.3 % (ref 11.5–15.5)
WBC: 6 10*3/uL (ref 4.0–10.5)

## 2013-06-18 MED ORDER — OXYCODONE-ACETAMINOPHEN 10-325 MG PO TABS: 1.0000 | ORAL_TABLET | Freq: Four times a day (QID) | ORAL | Status: DC | PRN

## 2013-06-18 MED ORDER — OXYCODONE HCL 5 MG PO TABS: 5.0000 mg | ORAL_TABLET | ORAL | Status: DC | PRN

## 2013-06-18 MED ORDER — METHOCARBAMOL 500 MG PO TABS: 500.0000 mg | ORAL_TABLET | Freq: Four times a day (QID) | ORAL | Status: AC | PRN

## 2013-06-18 MED ORDER — DSS 100 MG PO CAPS: 100.0000 mg | ORAL_CAPSULE | Freq: Two times a day (BID) | ORAL | Status: DC

## 2013-06-18 MED ORDER — ENOXAPARIN SODIUM 40 MG/0.4ML ~~LOC~~ SOLN: 40.0000 mg | SUBCUTANEOUS | Status: DC

## 2013-06-18 NOTE — Progress Notes (Signed)
I saw and examined the patient with Mr. Paul, communicating the findings and plan noted above.  Eilene Voigt, MD [facility] [phone] (p)  

## 2013-06-18 NOTE — Progress Notes (Signed)
Orthopaedic Trauma Service Progress Note  Subjective  Much improved Ready to go home Sitting in bedside chair Foot strap working well and tolerating  Objective   BP 115/76  Pulse 103  Temp(Src) 98.5 F (36.9 C) (Oral)  Resp 16  Ht 5\' 1"  (1.549 m)  Wt 52.164 kg (115 lb)  BMI 21.74 kg/m2  SpO2 98%  LMP 06/14/2013  Intake/Output     03/25 0701 - 03/26 0700 03/26 0701 - 03/27 0700   P.O. 460    I.V. (mL/kg)     Total Intake(mL/kg) 460 (8.8)    Urine (mL/kg/hr) 300 (0.2)    Blood     Total Output 300     Net +160          Urine Occurrence 603 x      Labs Results for [name] CapuchinBOWDEN, [name] (MRN [id number]) as of 06/18/2013 13:06  Ref. Range 06/18/2013 06:30  WBC Latest Range: 4.0-10.5 K/uL 6.0  RBC Latest Range: 3.87-5.11 MIL/uL 4.46  Hemoglobin Latest Range: 12.0-15.0 g/dL 40.910.7 (L)  HCT Latest Range: 36.0-46.0 % 33.0 (L)  MCV Latest Range: 78.0-100.0 fL 74.0 (L)  MCH Latest Range: 26.0-34.0 pg 24.0 (L)  MCHC Latest Range: 30.0-36.0 g/dL 81.132.4  RDW Latest Range: 11.5-15.5 % 14.3  Platelets Latest Range: 150-400 K/uL 280    Exam  Gen: awake and alert, much more comfortable today Lungs: clear Cardiac: RRR Abd: + BS, NTND Ext:       Right Lower Extremity   Dressing c/d/i             Swelling stable             Distal motor and sensory functions intact             + DP pulse  Tolerates motion much better today  Hinged brace fitting well and is unlocked     Assessment and Plan   POD/HD#: 2    1. R tibial plateau fracture:             NWB x 6 weeks             Unrestricted ROM R knee             Hinged knee brace, ok to take off when not up and moving around  Reviewed home exercises with pt              Ice and elevation             No pillows under knee when at rest, place under ankle             PT/OT evals  2. Pain management:                         Continue with current regimen  3. ABL anemia/Hemodynamics             Stable  4. DVT/PE  prophylaxis:             Lovenox             Will dc home with 3 week course of lovenox as well  5. ID:               Completed periop abx  6. Activity:             Activity as tolerated             NWB R leg  Motion as tolerated R knee   7. FEN/Foley/Lines:             diet as tolerated  Dc iv  8. Dispo:            dc home   Follow up with ortho in 10-14 days    Mearl Latin, PA-C [facility] [phone] (P) 06/18/2013 1:05 PM
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
## 2013-06-18 NOTE — Progress Notes (Signed)
Pt discharged to home. D/c instructions given. No questions verbalized. Vitals stable. 
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
## 2013-06-18 NOTE — Discharge Instructions (Signed)
Orthopaedic Trauma Service Discharge Instructions,   General Discharge Instructions  WEIGHT BEARING STATUS: Nonweightbearing Right leg  RANGE OF MOTION/ACTIVITY: Range of motion as tolerated R knee. Hinge brace at all times, can remove when lying down  Wound care: dressing changes starting on 06/20/2013. See instructions below  Diet: as you were eating previously.  Can use over the counter stool softeners and bowel preparations, such as Miralax, to help with bowel movements.  Narcotics can be constipating.  Be sure to drink plenty of fluids  STOP SMOKING OR USING NICOTINE PRODUCTS!!!!  As discussed nicotine severely impairs your body's ability to heal surgical and traumatic wounds but also impairs bone healing.  Wounds and bone heal by forming microscopic blood vessels (angiogenesis) and nicotine is a vasoconstrictor (essentially, shrinks blood vessels).  Therefore, if vasoconstriction occurs to these microscopic blood vessels they essentially disappear and are unable to deliver necessary nutrients to the healing tissue.  This is one modifiable factor that you can do to dramatically increase your chances of healing your injury.    (This means no smoking, no nicotine gum, patches, etc)  DO NOT USE NONSTEROIDAL ANTI-INFLAMMATORY DRUGS (NSAID'S)  Using products such as Advil (ibuprofen), Aleve (naproxen), Motrin (ibuprofen) for additional pain control during fracture healing can delay and/or prevent the healing response.  If you would like to take over the counter (OTC) medication, Tylenol (acetaminophen) is ok.  However, some narcotic medications that are given for pain control contain acetaminophen as well. Therefore, you should not exceed more than 4000 mg of tylenol in a day if you do not have liver disease.  Also note that there are may OTC medicines, such as cold medicines and allergy medicines that my contain tylenol as well.  If you have any questions about medications and/or interactions please  ask your doctor/PA or your pharmacist.   PAIN MEDICATION USE AND EXPECTATIONS  You have likely been given narcotic medications to help control your pain.  After a traumatic event that results in an fracture (broken bone) with or without surgery, it is ok to use narcotic pain medications to help control one's pain.  We understand that everyone responds to pain differently and each individual patient will be evaluated on a regular basis for the continued need for narcotic medications. Ideally, narcotic medication use should last no more than 6-8 weeks (coinciding with fracture healing).   As a patient it is your responsibility as well to monitor narcotic medication use and report the amount and frequency you use these medications when you come to your office visit.   We would also advise that if you are using narcotic medications, you should take a dose prior to therapy to maximize you participation.  IF YOU ARE ON NARCOTIC MEDICATIONS IT IS NOT PERMISSIBLE TO OPERATE A MOTOR VEHICLE (MOTORCYCLE/CAR/TRUCK/MOPED) OR HEAVY MACHINERY DO NOT MIX NARCOTICS WITH OTHER CNS (CENTRAL NERVOUS SYSTEM) DEPRESSANTS SUCH AS ALCOHOL       ICE AND ELEVATE INJURED/OPERATIVE EXTREMITY  Using ice and elevating the injured extremity above your heart can help with swelling and pain control.  Icing in a pulsatile fashion, such as 20 minutes on and 20 minutes off, can be followed.    Do not place ice directly on skin. Make sure there is a barrier between to skin and the ice pack.    Using frozen items such as frozen peas works well as the conform nicely to the are that needs to be iced.  USE AN ACE WRAP OR TED HOSE  FOR SWELLING CONTROL  In addition to icing and elevation, Ace wraps or TED hose are used to help limit and resolve swelling.  It is recommended to use Ace wraps or TED hose until you are informed to stop.    When using Ace Wraps start the wrapping distally (farthest away from the body) and wrap proximally  (closer to the body)   Example: If you had surgery on your leg or thing and you do not have a splint on, start the ace wrap at the toes and work your way up to the thigh        If you had surgery on your upper extremity and do not have a splint on, start the ace wrap at your fingers and work your way up to the upper arm  IF YOU ARE IN A SPLINT OR CAST DO NOT [place]   If your splint gets wet for any reason please contact the office immediately. You may shower in your splint or cast as long as you keep it dry.  This can be done by wrapping in a cast cover or garbage back (or similar)  Do Not stick any thing down your splint or cast such as pencils, money, or hangers to try and scratch yourself with.  If you feel itchy take benadryl as prescribed on the bottle for itching  IF YOU ARE IN A CAM BOOT (BLACK BOOT)  You may remove boot periodically. Perform daily dressing changes as noted below.  Wash the liner of the boot regularly and wear a sock when wearing the boot. It is recommended that you sleep in the boot until told otherwise  CALL THE OFFICE WITH ANY QUESTIONS OR CONCERTS: [phone]     Discharge Pin Site Instructions  Dress pins daily with Kerlix roll starting on POD 2. Wrap the Kerlix so that it tamps the skin down around the pin-skin interface to prevent/limit motion of the skin relative to the pin.  (Pin-skin motion is the primary cause of pain and infection related to external fixator pin sites).  Remove any crust or coagulum that may obstruct drainage with a saline moistened gauze or soap and water.  After POD 3, if there is no discernable drainage on the pin site dressing, the interval for change can by increased to every other day.  You may shower with the fixator, cleaning all pin sites gently with soap and water.  If you have a surgical wound this needs to be completely dry and without drainage before showering.  The extremity can be lifted by the fixator to  facilitate wound care and transfers.  Notify the office/Doctor if you experience increasing drainage, redness, or pain from a pin site, or if you notice purulent (thick, snot-like) drainage.  Discharge Wound Care Instructions  Do NOT apply any ointments, solutions or lotions to pin sites or surgical wounds.  These prevent needed drainage and even though solutions like hydrogen peroxide kill bacteria, they also damage cells lining the pin sites that help fight infection.  Applying lotions or ointments can keep the wounds moist and can cause them to breakdown and open up as well. This can increase the risk for infection. When in doubt call the office.  Surgical incisions should be dressed daily.  If any drainage is noted, use one layer of adaptic, then gauze, Kerlix, and an ace wrap.  Once the incision is completely dry and without drainage, it may be left open to air out.  Showering may begin 36-48 hours later.  Cleaning gently with soap and water.  Traumatic wounds should be dressed daily as well.    One layer of adaptic, gauze, Kerlix, then ace wrap.  The adaptic can be discontinued once the draining has ceased    If you have a wet to dry dressing: wet the gauze with saline the squeeze as much saline out so the gauze is moist (not soaking wet), place moistened gauze over wound, then place a dry gauze over the moist one, followed by Kerlix wrap, then ace wrap.

## 2013-07-03 DIAGNOSIS — F419 Anxiety disorder, unspecified: Secondary | ICD-10-CM | POA: Diagnosis present

## 2013-07-03 DIAGNOSIS — G43909 Migraine, unspecified, not intractable, without status migrainosus: Secondary | ICD-10-CM | POA: Diagnosis present

## 2013-07-03 DIAGNOSIS — K219 Gastro-esophageal reflux disease without esophagitis: Secondary | ICD-10-CM | POA: Diagnosis present
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
## 2013-07-17 ENCOUNTER — Emergency Department (HOSPITAL_COMMUNITY): Payer: Medicaid Other

## 2013-07-17 ENCOUNTER — Encounter (HOSPITAL_COMMUNITY): Payer: Self-pay | Admitting: Emergency Medicine

## 2013-07-17 ENCOUNTER — Emergency Department (HOSPITAL_COMMUNITY)
Admission: EM | Admit: 2013-07-17 | Discharge: 2013-07-18 | Disposition: A | Discharge status: Home / Self Care | Payer: Medicaid Other | Attending: Emergency Medicine | Admitting: Emergency Medicine

## 2013-07-17 DIAGNOSIS — Z8619 Personal history of other infectious and parasitic diseases: Secondary | ICD-10-CM | POA: Insufficient documentation

## 2013-07-17 DIAGNOSIS — Z8781 Personal history of (healed) traumatic fracture: Secondary | ICD-10-CM | POA: Insufficient documentation

## 2013-07-17 DIAGNOSIS — Y939 Activity, unspecified: Secondary | ICD-10-CM | POA: Insufficient documentation

## 2013-07-17 DIAGNOSIS — R296 Repeated falls: Secondary | ICD-10-CM | POA: Insufficient documentation

## 2013-07-17 DIAGNOSIS — Z8659 Personal history of other mental and behavioral disorders: Secondary | ICD-10-CM | POA: Insufficient documentation

## 2013-07-17 DIAGNOSIS — S8390XA Sprain of unspecified site of unspecified knee, initial encounter: Secondary | ICD-10-CM

## 2013-07-17 DIAGNOSIS — Z8679 Personal history of other diseases of the circulatory system: Secondary | ICD-10-CM | POA: Insufficient documentation

## 2013-07-17 DIAGNOSIS — IMO0002 Reserved for concepts with insufficient information to code with codable children: Secondary | ICD-10-CM | POA: Insufficient documentation

## 2013-07-17 DIAGNOSIS — Y929 Unspecified place or not applicable: Secondary | ICD-10-CM | POA: Insufficient documentation

## 2013-07-17 MED ORDER — HYDROMORPHONE HCL PF 1 MG/ML IJ SOLN
1.0000 mg | Freq: Once | INTRAMUSCULAR | Status: AC
  Administered 2013-07-17: 1 mg via INTRAVENOUS
  Filled 2013-07-17: qty 1

## 2013-07-17 NOTE — ED Notes (Signed)
Pt fell approx 1300 today.  Had surgical repair right leg by dr handy on 3/24.  Has been non wt bearing since.  Put leg down when she fell, put all her wt on it, heard a pop and now cannot extend leg.  Fentanyl 150mg  IV given en route for pt pain.
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
## 2013-07-18 MED ORDER — OXYCODONE-ACETAMINOPHEN 10-325 MG PO TABS: 1.0000 | ORAL_TABLET | Freq: Four times a day (QID) | ORAL | Status: DC | PRN

## 2013-07-18 MED ORDER — METHOCARBAMOL 500 MG PO TABS
1000.0000 mg | ORAL_TABLET | Freq: Once | ORAL | Status: AC
  Administered 2013-07-18: 1000 mg via ORAL
  Filled 2013-07-18: qty 2

## 2013-07-18 MED ORDER — METHOCARBAMOL 750 MG PO TABS: 750.0000 mg | ORAL_TABLET | Freq: Four times a day (QID) | ORAL | Status: DC

## 2013-07-18 MED ORDER — HYDROMORPHONE HCL PF 1 MG/ML IJ SOLN
1.0000 mg | Freq: Once | INTRAMUSCULAR | Status: AC
  Administered 2013-07-18: 1 mg via INTRAVENOUS
  Filled 2013-07-18: qty 1

## 2013-07-18 NOTE — Discharge Instructions (Signed)
Your xrays today do not show any new fractures or problems with your hardware.  Continue to keep weight off of your right leg.  Use crutches.  Take medications as prescribed.  Follow up with your orthopedist in 3-5 days.  Return to the ER for worsening condition or new concerning symptoms.   Cryotherapy Cryotherapy means treatment with cold. Ice or gel packs can be used to reduce both pain and swelling. Ice is the most helpful within the first 24 to 48 hours after an injury or flareup from overusing a muscle or joint. Sprains, strains, spasms, burning pain, shooting pain, and aches can all be eased with ice. Ice can also be used when recovering from surgery. Ice is effective, has very few side effects, and is safe for most people to use. PRECAUTIONS  Ice is not a safe treatment option for people with:  Raynaud's phenomenon. This is a condition affecting small blood vessels in the extremities. Exposure to cold may cause your problems to return.  Cold hypersensitivity. There are many forms of cold hypersensitivity, including:  Cold urticaria. Red, itchy hives appear on the skin when the tissues begin to warm after being iced.  Cold erythema. This is a red, itchy rash caused by exposure to cold.  Cold hemoglobinuria. Red blood cells break down when the tissues begin to warm after being iced. The hemoglobin that carry oxygen are passed into the urine because they cannot combine with blood proteins fast enough.  Numbness or altered sensitivity in the area being iced. If you have any of the following conditions, do not use ice until you have discussed cryotherapy with your caregiver:  Heart conditions, such as arrhythmia, angina, or chronic heart disease.  High blood pressure.  Healing wounds or open skin in the area being iced.  Current infections.  Rheumatoid arthritis.  Poor circulation.  Diabetes. Ice slows the blood flow in the region it is applied. This is beneficial when trying to  stop inflamed tissues from spreading irritating chemicals to surrounding tissues. However, if you expose your skin to cold temperatures for too long or without the proper protection, you can damage your skin or nerves. Watch for signs of skin damage due to cold. HOME CARE INSTRUCTIONS Follow these tips to use ice and cold packs safely.  Place a dry or damp towel between the ice and skin. A damp towel will cool the skin more quickly, so you may need to shorten the time that the ice is used.  For a more rapid response, add gentle compression to the ice.  Ice for no more than 10 to 20 minutes at a time. The bonier the area you are icing, the less time it will take to get the benefits of ice.  Check your skin after 5 minutes to make sure there are no signs of a poor response to cold or skin damage.  Rest 20 minutes or more in between uses.  Once your skin is numb, you can end your treatment. You can test numbness by very lightly touching your skin. The touch should be so light that you do not see the skin dimple from the pressure of your fingertip. When using ice, most people will feel these normal sensations in this order: cold, burning, aching, and numbness.  Do not use ice on someone who cannot communicate their responses to pain, such as small children or people with dementia. HOW TO MAKE AN ICE PACK Ice packs are the most common way to use ice  therapy. Other methods include ice massage, ice baths, and cryo-sprays. Muscle creams that cause a cold, tingly feeling do not offer the same benefits that ice offers and should not be used as a substitute unless recommended by your caregiver. To make an ice pack, do one of the following:  Place crushed ice or a bag of frozen vegetables in a sealable plastic bag. Squeeze out the excess air. Place this bag inside another plastic bag. Slide the bag into a pillowcase or place a damp towel between your skin and the bag.  Mix 3 parts water with 1 part  rubbing alcohol. Freeze the mixture in a sealable plastic bag. When you remove the mixture from the freezer, it will be slushy. Squeeze out the excess air. Place this bag inside another plastic bag. Slide the bag into a pillowcase or place a damp towel between your skin and the bag. SEEK MEDICAL CARE IF:  You develop white spots on your skin. This may give the skin a blotchy (mottled) appearance.  Your skin turns blue or pale.  Your skin becomes waxy or hard.  Your swelling gets worse. MAKE SURE YOU:   Understand these instructions.  Will watch your condition.  Will get help right away if you are not doing well or get worse. Document Released: 11/06/2010 Document Revised: 06/04/2011 Document Reviewed: 11/06/2010 [facility] Patient Information 2014 [place], [place].  Knee Sprain A knee sprain is a tear in one of the strong, fibrous tissues that connect the bones (ligaments) in your knee. The severity of the sprain depends on how much of the ligament is torn. The tear can be either partial or complete. CAUSES  Often, sprains are a result of a fall or injury. The force of the impact causes the fibers of your ligament to stretch too much. This excess tension causes the fibers of your ligament to tear. SIGNS AND SYMPTOMS  You may have some loss of motion in your knee. Other symptoms include:  Bruising.  Pain in the knee area.  Tenderness of the knee to the touch.  Swelling. DIAGNOSIS  To diagnose a knee sprain, your health care provider will physically examine your knee. Your health care provider may also suggest an X-ray exam of your knee to make sure no bones are broken. TREATMENT  If your ligament is only partially torn, treatment usually involves keeping the knee in a fixed position (immobilization) or bracing your knee for activities that require movement for several weeks. To do this, your health care provider will apply a bandage, cast, or splint to keep your knee from moving and  to support your knee during movement until it heals. For a partially torn ligament, the healing process usually takes 4 6 weeks. If your ligament is completely torn, depending on which ligament it is, you may need surgery to reconnect the ligament to the bone or reconstruct it. After surgery, a cast or splint may be applied and will need to stay on your knee for 4 6 weeks while your ligament heals. HOME CARE INSTRUCTIONS  Keep your injured knee elevated to decrease swelling.  To ease pain and swelling, apply ice to the injured area:  Put ice in a plastic bag.  Place a towel between your skin and the bag.  Leave the ice on for 20 minutes, 2 3 times a day.  Only take medicine for pain as directed by your health care provider.  Do not leave your knee unprotected until pain and stiffness go away (usually  4 6 weeks).  If you have a cast or splint, do not allow it to get wet. If you have been instructed not to remove it, cover it with a plastic bag when you shower or bathe. Do not swim.  Your health care provider may suggest exercises for you to do during your recovery to prevent or limit permanent weakness and stiffness. SEEK IMMEDIATE MEDICAL CARE IF:  Your cast or splint becomes damaged.  Your pain becomes worse.  You have significant pain, swelling, or numbness below the cast or splint. MAKE SURE YOU:  Understand these instructions.  Will watch your condition.  Will get help right away if you are not doing well or get worse. Document Released: 03/12/2005 Document Revised: 12/31/2012 Document Reviewed: 10/22/2012 [facility] Patient Information 2014 [place]

## 2014-01-25 ENCOUNTER — Encounter (HOSPITAL_COMMUNITY): Payer: Self-pay | Admitting: Emergency Medicine

## 2014-05-18 ENCOUNTER — Encounter (HOSPITAL_COMMUNITY): Payer: Self-pay | Admitting: Emergency Medicine

## 2014-05-18 ENCOUNTER — Emergency Department (HOSPITAL_COMMUNITY): Payer: Medicaid Other

## 2014-05-18 ENCOUNTER — Emergency Department (HOSPITAL_COMMUNITY)
Admission: EM | Admit: 2014-05-18 | Discharge: 2014-05-18 | Disposition: A | Discharge status: Home / Self Care | Payer: Medicaid Other | Attending: Emergency Medicine | Admitting: Emergency Medicine

## 2014-05-18 DIAGNOSIS — Y9289 Other specified places as the place of occurrence of the external cause: Secondary | ICD-10-CM | POA: Insufficient documentation

## 2014-05-18 DIAGNOSIS — Z8619 Personal history of other infectious and parasitic diseases: Secondary | ICD-10-CM | POA: Insufficient documentation

## 2014-05-18 DIAGNOSIS — S8991XA Unspecified injury of right lower leg, initial encounter: Secondary | ICD-10-CM | POA: Insufficient documentation

## 2014-05-18 DIAGNOSIS — Z8781 Personal history of (healed) traumatic fracture: Secondary | ICD-10-CM | POA: Diagnosis not present

## 2014-05-18 DIAGNOSIS — Z8659 Personal history of other mental and behavioral disorders: Secondary | ICD-10-CM | POA: Diagnosis not present

## 2014-05-18 DIAGNOSIS — Y9389 Activity, other specified: Secondary | ICD-10-CM | POA: Diagnosis not present

## 2014-05-18 DIAGNOSIS — Z8719 Personal history of other diseases of the digestive system: Secondary | ICD-10-CM | POA: Insufficient documentation

## 2014-05-18 DIAGNOSIS — Z79899 Other long term (current) drug therapy: Secondary | ICD-10-CM | POA: Diagnosis not present

## 2014-05-18 DIAGNOSIS — W1830XA Fall on same level, unspecified, initial encounter: Secondary | ICD-10-CM | POA: Diagnosis not present

## 2014-05-18 DIAGNOSIS — Z8679 Personal history of other diseases of the circulatory system: Secondary | ICD-10-CM | POA: Insufficient documentation

## 2014-05-18 DIAGNOSIS — Y998 Other external cause status: Secondary | ICD-10-CM | POA: Insufficient documentation

## 2014-05-18 DIAGNOSIS — M25561 Pain in right knee: Secondary | ICD-10-CM

## 2014-05-18 MED ORDER — NAPROXEN 250 MG PO TABS
500.0000 mg | ORAL_TABLET | Freq: Once | ORAL | Status: AC
  Administered 2014-05-18: 500 mg via ORAL
  Filled 2014-05-18: qty 2

## 2014-05-18 MED ORDER — NAPROXEN 500 MG PO TABS: 500.0000 mg | ORAL_TABLET | Freq: Two times a day (BID) | ORAL | Status: AC

## 2014-05-18 NOTE — ED Notes (Signed)
Declined W/C at D/C and was escorted to lobby by RN. 
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
## 2014-05-18 NOTE — Discharge Instructions (Signed)
Please follow up with your primary care physician in 1-2 days. If you do not have one please call the [facility] number listed above. Your x-ray was negative for any broken bones or other bony issues. Please read all discharge instructions and return precautions.   Knee Pain The knee is the complex joint between your thigh and your lower leg. It is made up of bones, tendons, ligaments, and cartilage. The bones that make up the knee are:  The femur in the thigh.  The tibia and fibula in the lower leg.  The patella or kneecap riding in the groove on the lower femur. CAUSES  Knee pain is a common complaint with many causes. A few of these causes are:  Injury, such as:  A ruptured ligament or tendon injury.  Torn cartilage.  Medical conditions, such as:  Gout  Arthritis  Infections  Overuse, over training, or overdoing a physical activity. Knee pain can be minor or severe. Knee pain can accompany debilitating injury. Minor knee problems often respond well to self-care measures or get well on their own. More serious injuries may need medical intervention or even surgery. SYMPTOMS The knee is complex. Symptoms of knee problems can vary widely. Some of the problems are:  Pain with movement and weight bearing.  Swelling and tenderness.  Buckling of the knee.  Inability to straighten or extend your knee.  Your knee locks and you cannot straighten it.  Warmth and redness with pain and fever.  Deformity or dislocation of the kneecap. DIAGNOSIS  Determining what is wrong may be very straight forward such as when there is an injury. It can also be challenging because of the complexity of the knee. Tests to make a diagnosis may include:  Your caregiver taking a history and doing a physical exam.  Routine X-rays can be used to rule out other problems. X-rays will not reveal a cartilage tear. Some injuries of the knee can be diagnosed by:  Arthroscopy a  surgical technique by which a small video camera is inserted through tiny incisions on the sides of the knee. This procedure is used to examine and repair internal knee joint problems. Tiny instruments can be used during arthroscopy to repair the torn knee cartilage (meniscus).  Arthrography is a radiology technique. A contrast liquid is directly injected into the knee joint. Internal structures of the knee joint then become visible on X-ray film.  An MRI scan is a non X-ray radiology procedure in which magnetic fields and a computer produce two- or three-dimensional images of the inside of the knee. Cartilage tears are often visible using an MRI scanner. MRI scans have largely replaced arthrography in diagnosing cartilage tears of the knee.  Blood work.  Examination of the fluid that helps to lubricate the knee joint (synovial fluid). This is done by taking a sample out using a needle and a syringe. TREATMENT The treatment of knee problems depends on the cause. Some of these treatments are:  Depending on the injury, proper casting, splinting, surgery, or physical therapy care will be needed.  Give yourself adequate recovery time. Do not overuse your joints. If you begin to get sore during workout routines, back off. Slow down or do fewer repetitions.  For repetitive activities such as cycling or running, maintain your strength and nutrition.  Alternate muscle groups. For example, if you are a weight lifter, work the upper body on one day and the lower body the next.  Either tight or  weak muscles do not give the proper support for your knee. Tight or weak muscles do not absorb the stress placed on the knee joint. Keep the muscles surrounding the knee strong.  Take care of mechanical problems.  If you have flat feet, orthotics or special shoes may help. See your caregiver if you need help.  Arch supports, sometimes with wedges on the inner or outer aspect of the heel, can help. These can  shift pressure away from the side of the knee most bothered by osteoarthritis.  A brace called an "unloader" brace also may be used to help ease the pressure on the most arthritic side of the knee.  If your caregiver has prescribed crutches, braces, wraps or ice, use as directed. The acronym for this is PRICE. This means protection, rest, ice, compression, and elevation.  Nonsteroidal anti-inflammatory drugs (NSAIDs), can help relieve pain. But if taken immediately after an injury, they may actually increase swelling. Take NSAIDs with food in your stomach. Stop them if you develop stomach problems. Do not take these if you have a history of ulcers, stomach pain, or bleeding from the bowel. Do not take without your caregiver's approval if you have problems with fluid retention, heart failure, or kidney problems.  For ongoing knee problems, physical therapy may be helpful.  Glucosamine and chondroitin are over-the-counter dietary supplements. Both may help relieve the pain of osteoarthritis in the knee. These medicines are different from the usual anti-inflammatory drugs. Glucosamine may decrease the rate of cartilage destruction.  Injections of a corticosteroid drug into your knee joint may help reduce the symptoms of an arthritis flare-up. They may provide pain relief that lasts a few months. You may have to wait a few months between injections. The injections do have a small increased risk of infection, water retention, and elevated blood sugar levels.  Hyaluronic acid injected into damaged joints may ease pain and provide lubrication. These injections may work by reducing inflammation. A series of shots may give relief for as long as 6 months.  Topical painkillers. Applying certain ointments to your skin may help relieve the pain and stiffness of osteoarthritis. Ask your pharmacist for suggestions. Many over the-counter products are approved for temporary relief of arthritis pain.  In some  countries, doctors often prescribe topical NSAIDs for relief of chronic conditions such as arthritis and tendinitis. A review of treatment with NSAID creams found that they worked as well as oral medications but without the serious side effects. PREVENTION  Maintain a healthy weight. Extra pounds put more strain on your joints.  Get strong, stay limber. Weak muscles are a common cause of knee injuries. Stretching is important. Include flexibility exercises in your workouts.  Be smart about exercise. If you have osteoarthritis, chronic knee pain or recurring injuries, you may need to change the way you exercise. This does not mean you have to stop being active. If your knees ache after jogging or playing basketball, consider switching to swimming, water aerobics, or other low-impact activities, at least for a few days a week. Sometimes limiting high-impact activities will provide relief.  Make sure your shoes fit well. Choose footwear that is right for your sport.  Protect your knees. Use the proper gear for knee-sensitive activities. Use kneepads when playing volleyball or laying carpet. Buckle your seat belt every time you drive. Most shattered kneecaps occur in car accidents.  Rest when you are tired. SEEK MEDICAL CARE IF:  You have knee pain that is continual and does  not seem to be getting better.  SEEK IMMEDIATE MEDICAL CARE IF:  Your knee joint feels hot to the touch and you have a high fever. MAKE SURE YOU:   Understand these instructions.  Will watch your condition.  Will get help right away if you are not doing well or get worse. Document Released: 01/07/2007 Document Revised: 06/04/2011 Document Reviewed: 01/07/2007 [facility] Patient Information 2015 [place], [place]. This information is not intended to replace advice given to you by your health care provider. Make sure you discuss any questions you have with your health care provider. RICE: Routine Care for Injuries The routine  care of many injuries includes Rest, Ice, Compression, and Elevation (RICE). HOME CARE INSTRUCTIONS Rest is needed to allow your body to heal. Routine activities can usually be resumed when comfortable. Injured tendons and bones can take up to 6 weeks to heal. Tendons are the cord-like structures that attach muscle to bone. Ice following an injury helps keep the swelling down and reduces pain. Put ice in a plastic bag. Place a towel between your skin and the bag. Leave the ice on for 15-20 minutes, 3-4 times a day, or as directed by your health care provider. Do this while awake, for the first 24 to 48 hours. After that, continue as directed by your caregiver. Compression helps keep swelling down. It also gives support and helps with discomfort. If an elastic bandage has been applied, it should be removed and reapplied every 3 to 4 hours. It should not be applied tightly, but firmly enough to keep swelling down. Watch fingers or toes for swelling, bluish discoloration, coldness, numbness, or excessive pain. If any of these problems occur, remove the bandage and reapply loosely. Contact your caregiver if these problems continue. Elevation helps reduce swelling and decreases pain. With extremities, such as the arms, hands, legs, and feet, the injured area should be placed near or above the level of the heart, if possible. SEEK IMMEDIATE MEDICAL CARE IF: You have persistent pain and swelling. You develop redness, numbness, or unexpected weakness. Your symptoms are getting worse rather than improving after several days. These symptoms may indicate that further evaluation or further X-rays are needed. Sometimes, X-rays may not show a small broken bone (fracture) until 1 week or 10 days later. Make a follow-up appointment with your caregiver. Ask when your X-ray results will be ready. Make sure you get your X-ray results. Document Released: 06/24/2000 Document Revised: 03/17/2013 Document Reviewed:  08/11/2010 [facility] Patient Information 2015 [place], [place]. This information is not intended to replace advice given to you by your health care provider. Make sure you discuss any questions you have with your health care provider.
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
## 2014-05-18 NOTE — ED Notes (Signed)
C/o right knee pain X1 month after fall (hx of knee injury), no other complaints, no meds pta, A/O X4, ambulatory and in NAD
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
## 2014-12-03 IMAGING — CT CT KNEE*R* W/O CM
2 of 4 series · 4 of 14 positions shown, 5 images · non-contrast
Comparison: DG TIBIA/FIBULA*R* dated 06/09/2013

CLINICAL DATA: Fracture.

EXAM:
CT OF THE RIGHT KNEE WITHOUT CONTRAST
TECHNIQUE: Multidetector CT imaging was performed according to the standard
protocol. Multiplanar CT image reconstructions were also generated.

[Series 7: axial bone · axial · 0.21mm/px · z∈[-901,-847]mm · 2 of 81 slices shown, 3 images]
[im 27/81  soft-tissue]
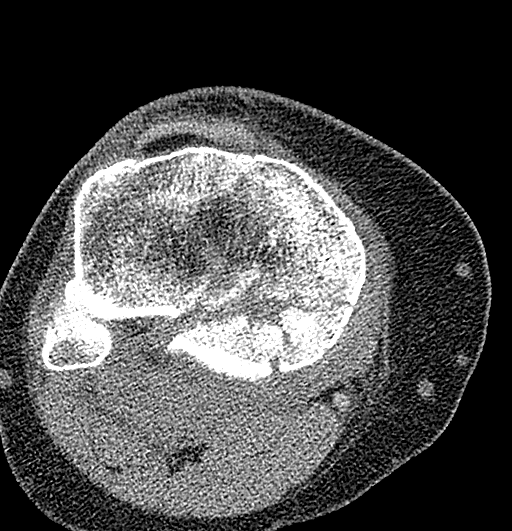
[im 27/81  bone]
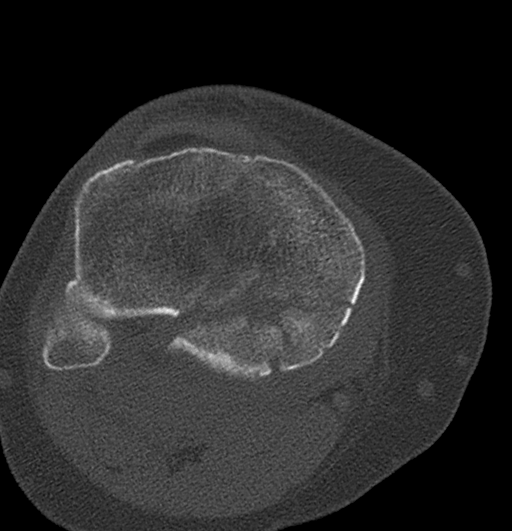
[im 54/81  bone]
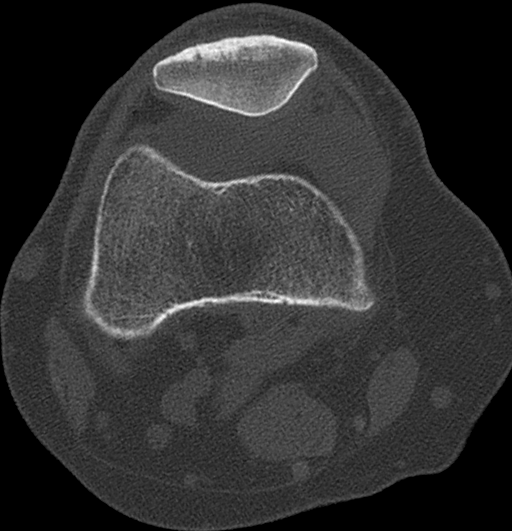

[Series 10: axial st · axial · 0.21mm/px · z∈[-901,-847]mm · 2 of 81 slices shown]
[im 27/81  bone]
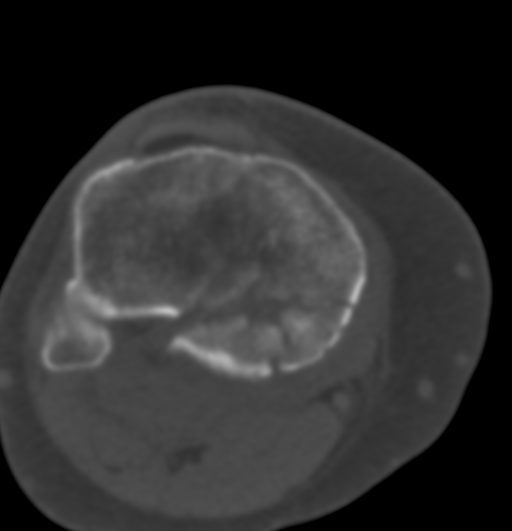
[im 54/81  bone]
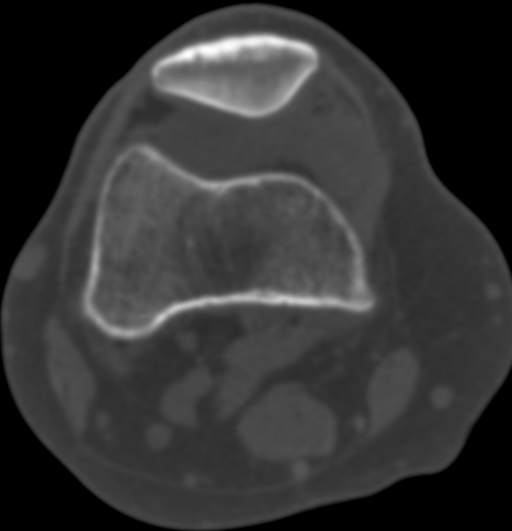

[4 of 14 positions shown; findings below may reference images not displayed]

FINDINGS: Distal femur normal. Patella normal. Extensive comminuted displaced
fractures are noted throughout the tibial plateau extension of the
fracture into the femoral tibial joint space present. Significant
depression of the posterior fracture fragments are present been
depressed up to 4 mm. Fragments are noted in the femoral tibial
joint space. Proximal fibula is intact. Knee joint effusion is
present. Fragments are noted within the fusion. Air is noted within
the joint space consistent with open injury.
IMPRESSION: 1. Extensive comminuted, displaced, depressed fractures of the
tibial plateau with extension into the femoral tibial joint space.
Prominent fragments are noted within the joint space, including the
suprapatellar space. Large effusion present. 2. Air is noted about
the knee joint indicating an open injury.

## 2021-11-18 ENCOUNTER — Ambulatory Visit: Admission: EM | Admit: 2021-11-18 | Discharge: 2021-11-18 | Discharge status: Absent without leave | Payer: Self-pay

## 2021-11-18 ENCOUNTER — Ambulatory Visit
Admission: EM | Admit: 2021-11-18 | Discharge: 2021-11-18 | Disposition: A | Discharge status: Home / Self Care | Payer: BC Managed Care – PPO

## 2021-11-18 ENCOUNTER — Emergency Department (HOSPITAL_COMMUNITY): Payer: BC Managed Care – PPO

## 2021-11-18 ENCOUNTER — Encounter: Payer: Self-pay | Admitting: Emergency Medicine

## 2021-11-18 ENCOUNTER — Emergency Department (HOSPITAL_COMMUNITY)
Admission: EM | Admit: 2021-11-18 | Discharge: 2021-11-19 | Disposition: A | Discharge status: Home / Self Care | Payer: BC Managed Care – PPO | Attending: Emergency Medicine | Admitting: Emergency Medicine

## 2021-11-18 ENCOUNTER — Other Ambulatory Visit: Payer: Self-pay

## 2021-11-18 DIAGNOSIS — S91312A Laceration without foreign body, left foot, initial encounter: Secondary | ICD-10-CM

## 2021-11-18 DIAGNOSIS — Z23 Encounter for immunization: Secondary | ICD-10-CM | POA: Diagnosis not present

## 2021-11-18 DIAGNOSIS — S99922A Unspecified injury of left foot, initial encounter: Secondary | ICD-10-CM | POA: Diagnosis present

## 2021-11-18 DIAGNOSIS — W274XXA Contact with kitchen utensil, initial encounter: Secondary | ICD-10-CM | POA: Diagnosis not present

## 2021-11-18 MED ORDER — IBUPROFEN 400 MG PO TABS
600.0000 mg | ORAL_TABLET | Freq: Once | ORAL | Status: AC
  Administered 2021-11-19: 600 mg via ORAL
  Filled 2021-11-18: qty 1

## 2021-11-18 MED ORDER — TETANUS-DIPHTH-ACELL PERTUSSIS 5-2.5-18.5 LF-MCG/0.5 IM SUSY
0.5000 mL | PREFILLED_SYRINGE | Freq: Once | INTRAMUSCULAR | Status: AC
  Administered 2021-11-19: 0.5 mL via INTRAMUSCULAR
  Filled 2021-11-18: qty 0.5

## 2021-11-18 NOTE — ED Triage Notes (Signed)
Pt is present today with laceration on the left foot. PT states that she stepped on a vegetable slicer today.

## 2021-11-18 NOTE — ED Triage Notes (Signed)
Patient w/ laceration to the lateral aspect of her L foot. She stated she stepped on a vegetable slicer. Food is bandaged and bleeding controlled at present.

## 2021-11-18 NOTE — ED Provider Triage Note (Signed)
Emergency Medicine Provider Triage Evaluation Note  Athene Schuhmacher , a 41 y.o. female  was evaluated in triage.  Pt complains of laceration.  Patient states that about 10 AM this morning she stepped on a mandolin in her kitchen.  States that she sliced the lateral aspect of the left plantar surface of the foot.  Last tetanus 2014. Went to urgent care and they sent her here.  Not anticoagulated  Review of Systems  Positive:  Negative:   Physical Exam  BP 114/87 (BP Location: Right Arm)   Pulse 93   Temp 98.4 F (36.9 C) (Oral)   Resp 18   SpO2 100%  Gen:   Awake, no distress   Resp:  Normal effort  MSK:   Moves extremities without difficulty  Other:  2-1/2 to 3 cm semicircular laceration to the lateral aspect of the left foot that goes onto the plantar aspect of the foot.  Bleeding is controlled.  Medical Decision Making  Medically screening exam initiated at 3:50 PM.  Appropriate orders placed.  Malarie Tappen was informed that the remainder of the evaluation will be completed by another provider, this initial triage assessment does not replace that evaluation, and the importance of remaining in the ED until their evaluation is complete.  X-ray for foreign body   Cristopher Peru, PA-C 11/18/21 1551

## 2021-11-18 NOTE — ED Notes (Signed)
Pt presents with L foot injury/ laceration. Nurse pulled patient back to apply pressure dressing to wound. Bleeding controlled with pressure dressed informed patient to let us know If bleeding seeps though the bandage.
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
## 2021-11-18 NOTE — ED Notes (Signed)
Patient is being discharged from the Urgent Care and sent to the Emergency Department via pov . Per respat pa, patient is in need of higher level of care due to pt inability to tolerate wound closure without further pain management Patient is aware and verbalizes understanding of plan of care.  Vitals:   11/18/21 1249  BP: 112/80  Pulse: 70  Resp: 17  Temp: 98.1 F (36.7 C)  SpO2: 99%

## 2021-11-19 MED ORDER — LIDOCAINE-EPINEPHRINE (PF) 2 %-1:200000 IJ SOLN
20.0000 mL | Freq: Once | INTRAMUSCULAR | Status: AC
  Administered 2021-11-19: 20 mL
  Filled 2021-11-19: qty 20

## 2021-11-19 NOTE — Discharge Instructions (Signed)
Keep clean and dressed.  Monitor for signs and symptoms of infection.  Use bacitracin ointment.  Return for suture removal in 10 to 14 days.

## 2021-11-19 NOTE — ED Notes (Signed)
Patient verbalizes understanding of discharge instructions. Opportunity for questioning and answers were provided. Armband removed by staff, pt discharged from ED. Wheeled out to lobby  
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
## 2021-12-13 ENCOUNTER — Ambulatory Visit
Admission: RE | Admit: 2021-12-13 | Discharge: 2021-12-13 | Disposition: A | Discharge status: Home / Self Care | Payer: BC Managed Care – PPO | Source: Ambulatory Visit

## 2021-12-13 VITALS — BP 125/86 | HR 92 | Temp 98.1°F | Resp 18

## 2021-12-13 DIAGNOSIS — Z4802 Encounter for removal of sutures: Secondary | ICD-10-CM

## 2021-12-13 NOTE — ED Triage Notes (Signed)
Pt here to get sutures removed that were placed 8/26. Reports states pain and area looks bad. Pt had left foot wrapped upon triage.

## 2021-12-13 NOTE — ED Provider Notes (Signed)
I evaluated sutures through triage. They are ready for removal. Wound looks great. No signs of infection.       Jaynee Eagles, PA-C 12/13/21 1025

## 2022-04-02 ENCOUNTER — Telehealth: Payer: Self-pay

## 2022-04-02 NOTE — Telephone Encounter (Signed)
Mychart msg sent

## 2024-03-04 ENCOUNTER — Ambulatory Visit
Admission: EM | Admit: 2024-03-04 | Discharge: 2024-03-04 | Disposition: A | Discharge status: Home / Self Care | Attending: Nurse Practitioner | Admitting: Nurse Practitioner

## 2024-03-04 DIAGNOSIS — R202 Paresthesia of skin: Secondary | ICD-10-CM

## 2024-03-04 DIAGNOSIS — T63301A Toxic effect of unspecified spider venom, accidental (unintentional), initial encounter: Secondary | ICD-10-CM | POA: Diagnosis not present

## 2024-03-04 MED ORDER — DOXYCYCLINE HYCLATE 100 MG PO CAPS: 100.0000 mg | ORAL_CAPSULE | Freq: Two times a day (BID) | ORAL | 0 refills | Status: AC
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
## 2024-03-04 NOTE — ED Notes (Signed)
 In with NP for breast exam

## 2024-03-04 NOTE — Discharge Instructions (Addendum)
 You were seen today for tingling, numbness, and burning in your right arm that began after possible insect exposure with earlier armpit swelling. Blood tests have been ordered to check for tick-borne infections such as Lyme disease and [facility] spotted fever, and you were started on doxycycline. While on doxycycline, avoid excess sun exposure, take each dose with plenty of water, remain upright for 30 minutes after dosing, and avoid taking it with dairy or antacids. To help with symptoms, rest the arm, avoid heavy use that worsens discomfort, apply warm compresses if helpful, and use over-the-counter pain relievers as needed if safe for you.   Because of the previous armpit swelling, a diagnostic mammogram and right breast ultrasound have been ordered for further evaluation. The imaging center will contact you to schedule these studies. Follow up with your primary care provider to review lab and imaging results or if symptoms do not improve after treatment, worsen, or begin to limit strength or daily activities. Seek emergency care for sudden arm or facial weakness, trouble speaking, confusion, severe or rapidly worsening pain, new complete numbness, chest pain, shortness of breath, fainting, spreading rash, or any sudden or concerning change in your condition.

## 2024-03-04 NOTE — ED Triage Notes (Addendum)
 Pt c/o tingling to RUE x 2 weeks-denies injury-states I had a place that looked like maybe a spider bite and it went away-NAD-steady gait-pt added she feels left breast swelling noticed yesterday

## 2024-03-06 LAB — SPOTTED FEVER GROUP ANTIBODIES
Spotted Fever Group IgG: <1:64 {titer}
Spotted Fever Group IgM: <1:64 {titer}

## 2024-03-06 LAB — LYME DISEASE SEROLOGY W/REFLEX: Lyme Total Antibody EIA: NEGATIVE

## 2024-03-09 ENCOUNTER — Inpatient Hospital Stay: Admission: RE | Admit: 2024-03-09 | Discharge: 2024-03-09 | Attending: Nurse Practitioner

## 2024-03-09 ENCOUNTER — Ambulatory Visit (HOSPITAL_COMMUNITY): Payer: Self-pay
